# Patient Record
Sex: Male | Born: 1975 | Race: White | Hispanic: No | Marital: Married | Smoking: Never smoker
Health system: Southern US, Community
[De-identification: ages and names within clinical notes are randomized; demographics above are authoritative.]

## PROBLEM LIST (undated history)

## (undated) DIAGNOSIS — F419 Anxiety disorder, unspecified: Secondary | ICD-10-CM

## (undated) DIAGNOSIS — G709 Myoneural disorder, unspecified: Secondary | ICD-10-CM

## (undated) HISTORY — PX: REPAIR ANKLE LIGAMENT: SUR1187

## (undated) HISTORY — PX: FRACTURE SURGERY: SHX138

## (undated) HISTORY — PX: APPENDECTOMY: SHX54

---

## 2003-07-01 ENCOUNTER — Inpatient Hospital Stay (HOSPITAL_COMMUNITY): Admission: EM | Admit: 2003-07-01 | Discharge: 2003-07-02 | Payer: Self-pay | Admitting: Emergency Medicine

## 2003-07-01 ENCOUNTER — Encounter (INDEPENDENT_AMBULATORY_CARE_PROVIDER_SITE_OTHER): Payer: Self-pay | Admitting: *Deleted

## 2005-11-15 ENCOUNTER — Emergency Department (HOSPITAL_COMMUNITY): Admission: EM | Admit: 2005-11-15 | Discharge: 2005-11-16 | Payer: Self-pay | Admitting: Emergency Medicine

## 2007-06-14 ENCOUNTER — Emergency Department (HOSPITAL_COMMUNITY): Admission: EM | Admit: 2007-06-14 | Discharge: 2007-06-15 | Payer: Self-pay | Admitting: Emergency Medicine

## 2007-06-19 ENCOUNTER — Ambulatory Visit (HOSPITAL_COMMUNITY): Admission: RE | Admit: 2007-06-19 | Discharge: 2007-06-20 | Payer: Self-pay | Admitting: Orthopaedic Surgery

## 2010-08-12 ENCOUNTER — Ambulatory Visit
Admission: RE | Admit: 2010-08-12 | Discharge: 2010-08-12 | Disposition: A | Discharge status: Home / Self Care | Payer: BC Managed Care – PPO | Source: Ambulatory Visit | Attending: Ophthalmology | Admitting: Ophthalmology

## 2010-08-12 ENCOUNTER — Other Ambulatory Visit: Payer: Self-pay | Admitting: Ophthalmology

## 2010-08-12 DIAGNOSIS — R51 Headache: Secondary | ICD-10-CM

## 2010-08-12 MED ORDER — IOHEXOL 300 MG/ML  SOLN
75.0000 mL | Freq: Once | INTRAMUSCULAR | Status: AC | PRN
  Administered 2010-08-12: 75 mL via INTRAVENOUS

## 2010-10-20 NOTE — Op Note (Signed)
Cory Alexander, Cory Alexander              ACCOUNT NO.:  [id number]   MEDICAL RECORD NO.:  [id number]          PATIENT TYPE:  OIB   LOCATION:  3528                         FACILITY:  [facility]   PHYSICIAN:  Oma C. Ophelia Charter, M.D.    DATE OF BIRTH:  July 23, 1975   DATE OF PROCEDURE:  06/19/2007  DATE OF DISCHARGE:                               OPERATIVE REPORT   PREOPERATIVE DIAGNOSIS:  Comminuted left distal radius fracture.   POSTOPERATIVE DIAGNOSIS:  Comminuted left distal radius fracture.   PROCEDURE:  ORIF left distal radius, volar plating.   SURGEON:  Nesbit C. Ophelia Charter, M.D.   ANESTHESIA:  MAL, general.   TOURNIQUET TIME:  1 hour 24 minutes.   DRAINS:  None.   COMPONENTS USED:  [employer] volar plate, wide, standard  length.   BRIEF HISTORY:  This 35 year old male was playing in a basketball game,  was running, tripped over a curb, fell, suffering a distal radius  fracture, comminuted, with some volar and dorsal comminution and  involvement of the distal RU joint with displacement.   PROCEDURE:  After induction of anesthesia, proximal arm tourniquet,  standard prepping and draping up to the level the tourniquet,  stockinette, extremity sheets and drapes were applied.  Time-out  procedure was taken.  The patient received preoperative antibiotics.  Time-out was confirmed.   Sterile skin marker was used, followed by reapplication of stockinette,  sponge in the palm, wrapped with an Esmarch, tourniquet inflation to  250.  A volar incision was made, and split over the FCR was performed,  and then splitting through the posterior aspect of the FCR sheath.  Pronator was identified and peeled from the radial aspect of the radius  subperiosteally to expose the fracture site.  Care was taken to not put  undue tension, and FCR was used to help protect the radial artery.  There was some cortical deformation of the distal fragment, and the  fracture was distracted by manual traction.  A  Cory Alexander was placed, and  hematoma was suctioned out, as well as a tiny curette was used with  irrigation to clamp the fracture site.  Fracture was reduced, checked  under fluoroscopy, and a K-wire was drilled across the styloid to help  stabilize it due to the comminution.  This held it out to length, and  with the volar cortex reduced and the pin in the styloid piece, the  volar cortex was stable.  Wide plate was selected, since the patient was  large, and plate was adjusted proximally and distally, medial and  lateral, checked under fluoroscopy, and then the slotted screw was  drilled, which was an 18.  Another screw was placed more proximally, 16,  and then the distal row started to fill, and it was checked under  fluoroscopy, starting from the ulnar aspect distal row.  It was apparent  that the plate was a little too far distal.  Screws were backed up.  They were coming up next to the joint.  With the drill, the distalmost  ulnar peg was just a 16 or 18 and was already hitting  a portion of the  joint.  The most proximal screw was removed.  Slotted screw was backed  up.  Plate was slid slightly more proximal, then locked down, checked  under fluoroscopy, and then the pegs placed starting on the distal row,  extending toward the radial aspect, and then proximal row starting in  the ulnar aspect, with threaded screw in the proximal ulnarmost hole,  and all the rest were smooth pegs.  All screws were checked.  All were  locked down.  A total of three bicortical screws were placed proximally,  the one in the slot and then the two more proximal to that.  The wound  was irrigated.  Spot fluoro pictures were taken with the wrist in  supination and full pronation AP, with hand elevated looking directly  down the joint.  No screws were in the joint.  There was anatomic  position and reduction with less than 0.5 or maybe 0.25 mm displacement  seen on fluoroscopy and with direct visualization.   After irrigation  with saline solution, 3-0 Vicryl was used in the subcutaneous tissue.  The wound was irrigated, tourniquet deflated prior to closure, 4-0  Vicryl subcuticular.  Tincture of Benzoin, Steri-Strips, Marcaine  infiltration.  Xeroform, 4 x 4's, __________ , and a dorsal splint.  Instrument count and needle count was correct.      Kristen C. Ophelia Charter, M.D.  Electronically Signed     MCY/MEDQ  D:  06/19/2007  T:  06/20/2007  Job:  [id number]

## 2010-10-23 NOTE — Op Note (Signed)
Cory Alexander, Cory Alexander                          ACCOUNT NO.:  [id number]   MEDICAL RECORD NO.:  [id number]                   PATIENT TYPE:  INP   LOCATION:  1825                                 FACILITY:  [facility]   PHYSICIAN:  Gabrielle Dare. [name], M.D.             DATE OF BIRTH:  08-Sep-1975   DATE OF PROCEDURE:  07/01/2003  DATE OF DISCHARGE:                                 OPERATIVE REPORT   PREOPERATIVE DIAGNOSIS:  Acute appendicitis.   POSTOPERATIVE DIAGNOSIS:  Acute appendicitis.   OPERATION PERFORMED:  Laparoscopic appendectomy.   SURGEON:  Gabrielle Dare. [name], M.D.   ANESTHESIA:  General.   INDICATIONS FOR PROCEDURE:  The patient is a 35 year old white male with a  history of an anxiety disorder, who is otherwise healthy, who developed  abdominal pain last night.  He had no appetite all day yesterday, eating  only a few crackers.  He felt somewhat bloated and developed some  generalized abdominal pain that localized in his right lower quadrant.  He  came to [facility] Emergency Department for evaluation.  Work-up there  showed his white blood cell at 17,000. CT scan of the abdomen and pelvis was  obtained which was consistent with acute appendicitis.  On physical exam the  patient was found to have amplified tenderness and guarding in the right  lower quadrant.  He is brought to the operating room for emergent  appendectomy.   DESCRIPTION OF PROCEDURE:  Informed consent was obtained.  The patient  received intravenous antibiotics and was brought to the operating room.  General anesthesia was administered.  His abdomen was prepped and draped in  sterile fashion.  A curvilinear infraumbilical incision was made.  Subcutaneous tissues were dissected down revealing the anterior fracture  which was divided sharply.  The peritoneal cavity was then entered under  direct vision without difficulty.  A 0 Vicryl pursestring suture was placed  around the fascial opening.  The Hasson  trocar was inserted into the abdomen  and the abdomen was insufflated with carbon dioxide in the standard fashion.  The abdomen was explored.  This revealed an inflamed appendix.  Under direct  vision, a 12 mm left lower quadrant coordinating 5 mm right upper quadrant  port were placed.  Subsequently the appendix was closely inspected.  The  base was not inflamed but from proximally, mid to midportion distally it was  significantly inflamed with some fibrinous exudate but no frank perforation.  There was no significant free fluid. First the base was circumferentially  dissected.  Once a nice window was made around the base of the appendix,  this was divided at the cecum with an endoscopic GIA stapler with a tissue  load.  Subsequently, the mesoappendix was freed up with some blunt  dissection and then divided sequentially with several reloads of the  endoscopic GIA stapler with vascular insert.  These were fired along the  underside  of the appendix.  It was quite lengthy and several firings were  required.  Excellent hemostasis was obtained.  Once the appendix was  removed, it was placed in an EndoCatch bag and taken out the abdomen via the  left lower quadrant port site.  The cecum and terminal ileum area were  reinspected.  All of the staple lines were intact and the area was  hemostatic.  The area was then irrigated with two liters of sterile saline.  The fluid evacuated was clear and staple lines were reinspected. Good  hemostasis persisted.  The ports were taken out under direct vision.  The  pneumoperitoneum was released and the Hasson trocar was removed.  The fascia  at the umbilical port site was closed by tying the 0 Vicryl pursestring  suture. All three wounds were copiously irrigated.  0.25% Marcaine with  epinephrine had been used for local anesthetic and skin was closed with  running 4-0 Vicryl  subcuticular stitch at each incision and Benzoin and Steri-Strips were  applied.   Sterile dressings were placed.  Sponge, needle and instrument  counts were all correct. The patient tolerated the procedure well without  apparent complication and was taken to the recovery room in stable  condition.                                               Gabrielle Dare [name], M.D.    BET/MEDQ  D:  07/01/2003  T:  07/01/2003  Job:  [id number]

## 2010-10-23 NOTE — Discharge Summary (Signed)
Cory Alexander, Cory Alexander                          ACCOUNT NO.:  [id number]   MEDICAL RECORD NO.:  [id number]                   PATIENT TYPE:  INP   LOCATION:  5735                                 FACILITY:  [facility]   PHYSICIAN:  Gabrielle Dare. [name], M.D.             DATE OF BIRTH:  1975-09-07   DATE OF ADMISSION:  06/30/2003  DATE OF DISCHARGE:  07/02/2003                                 DISCHARGE SUMMARY   DISCHARGE DIAGNOSES:  1. Acute appendicitis.  2. Status post laparoscopic appendectomy.   HISTORY OF PRESENT ILLNESS:  The patient is a 35 year old white male with a  history of anxiety disorder who was otherwise healthy.  He developed  abdominal pain.  Workup in the emergency department was consistent with  acute appendicitis and he was admitted and taken to the operating room  emergently.   HOSPITAL COURSE:  The patient underwent an uncomplicated laparoscopic  appendectomy for a very inflamed but nonperforated appendicitis.  Postoperatively, he remained afebrile and hemodynamically stable.  He  tolerated gradual advancement of his diet.  He had good pain control with  oral pain medication and was discharged home in stable condition.   DISCHARGE DIET:  Regular.   DISCHARGE ACTIVITY:  No lifting.   DISCHARGE MEDICATIONS:  1. Percocet 5/325 mg one to two p.o. q.4-6h. p.r.n. pain.  2. He also is to continue his Paxil as previously.   FOLLOW UP:  Gabrielle Dare. [name], M.D., in three weeks.                                                Gabrielle Dare [name], M.D.    BET/MEDQ  D:  07/02/2003  T:  07/02/2003  Job:  [id number]

## 2010-10-23 NOTE — H&P (Signed)
NAMESLATON, Cory Alexander                          ACCOUNT NO.:  [id number]   MEDICAL RECORD NO.:  [id number]                   PATIENT TYPE:  INP   LOCATION:  1825                                 FACILITY:  [facility]   PHYSICIAN:  Gabrielle Dare. [name], M.D.             DATE OF BIRTH:  1975/12/02   DATE OF ADMISSION:  07/01/2003  DATE OF DISCHARGE:                                HISTORY & PHYSICAL   CHIEF COMPLAINT:  Right lower quadrant abdominal pain.   HISTORY OF PRESENT ILLNESS:  The patient is a 35 year old white male with a  history of anxiety disorder, who developed abdominal pain last night.  All  during the day yesterday, he had decreased appetite and only ate some  crackers.  The pain persisted and localized more in his right lower  quadrant; he presented to the emergency department at [facility] for  evaluation.  Workup by the emergency department physician included white  blood count which was elevated at 17,000.  CT scan of the abdomen and pelvis  was obtained; this was consistent with acute appendicitis.  The patient  claims the pain has been pretty steady, it stays down in his right lower  quadrant at this time without radiation and he denies any other complaints  at this time.   PAST MEDICAL HISTORY:  Anxiety disorder.   PAST SURGICAL HISTORY:  Wisdom teeth extraction.   SOCIAL HISTORY:  He does not smoke.  He drinks alcohol occasionally.  He  works as an Dance movement psychotherapist.   MEDICATIONS:  Paxil 25 mg p.o. daily.   ALLERGIES:  No known drug allergies.   REVIEW OF SYSTEMS:  GENERAL:  Patient feels moderately ill.  CARDIOVASCULAR:  Negative.  PULMONARY:  Negative.  GI:  See the history of present illness.  GU:  Negative.  MUSCULOSKELETAL:  Negative.   PHYSICAL EXAMINATION:  VITAL SIGNS:  Temperature is 101.0, blood pressure  126/78, heart rate 82, respirations 18.  GENERAL:  He is awake and alert.  HEENT:  Pupils are equal and reactive.  Sclerae are nonicteric.  NECK:  His neck  is supple with no masses.  LUNGS:  Lungs are clear to auscultation bilaterally.  HEART:  Regular rate and rhythm.  PMI is palpable in the left chest.  ABDOMEN:  His abdomen is soft with decreased bowel sounds.  He does have  localized tenderness in the right lower quadrant with guarding.  He also has  a positive Rovsing's sign.  SKIN:  Skin is warm and dry with no rashes.  EXTREMITIES:  The extremities have no significant pedal edema.   DATA:  Data reviewed include white blood cell count 17,000 and CT scan of  the abdomen and pelvis consistent with acute appendicitis.   IMPRESSION AND PLAN:  Acute appendicitis.   PLAN:  We will take him to the operating room for an emergency appendectomy.  The procedure of laparoscopic appendectomy including its risks  and benefits  were discussed in detail with the patient, who is eager to proceed.  Other  questions were answered and we will go on to the operating room.                                                Gabrielle Dare [name], M.D.    BET/MEDQ  D:  07/01/2003  T:  07/01/2003  Job:  [id number]

## 2011-02-24 LAB — CBC
MCHC: 34.7
MCV: 91.8
RDW: 12.5

## 2011-02-24 LAB — BASIC METABOLIC PANEL
BUN: 9
CO2: 30
Calcium: 9.5
Chloride: 106
Creatinine, Ser: 1.07
Glucose, Bld: 93

## 2012-02-02 ENCOUNTER — Encounter (HOSPITAL_COMMUNITY): Payer: Self-pay | Admitting: Pharmacy Technician

## 2012-02-03 ENCOUNTER — Other Ambulatory Visit (HOSPITAL_COMMUNITY): Payer: Self-pay | Admitting: Orthopedic Surgery

## 2012-02-04 ENCOUNTER — Encounter (HOSPITAL_COMMUNITY)
Admission: RE | Admit: 2012-02-04 | Discharge: 2012-02-04 | Disposition: A | Discharge status: Home / Self Care | Payer: BC Managed Care – PPO | Source: Ambulatory Visit | Attending: Orthopedic Surgery | Admitting: Orthopedic Surgery

## 2012-02-04 ENCOUNTER — Encounter (HOSPITAL_COMMUNITY): Payer: Self-pay

## 2012-02-04 HISTORY — DX: Myoneural disorder, unspecified: G70.9

## 2012-02-04 LAB — CBC
HCT: 39.7 % (ref 39.0–52.0)
Hemoglobin: 14.1 g/dL (ref 13.0–17.0)
MCH: 30.9 pg (ref 26.0–34.0)
MCHC: 35.5 g/dL (ref 30.0–36.0)
RDW: 12.4 % (ref 11.5–15.5)

## 2012-02-04 LAB — SURGICAL PCR SCREEN
MRSA, PCR: NEGATIVE
Staphylococcus aureus: NEGATIVE

## 2012-02-04 NOTE — Pre-Procedure Instructions (Signed)
20 Cory Alexander  02/04/2012   Your procedure is scheduled on:  02/08/2012  Report to [facility] at 5:30 AM.  Call this number if you have problems the morning of surgery: [phone]   Remember:   Do not eat food or drink any liquidAfter Midnight.- MONDAY    Take these medicines the morning of surgery with A SIP OF WATER: NONE   Do not wear jewelry, make-up or nail polish.  Do not wear lotions, powders, or perfumes. You may wear deodorant.  Do not shave 48 hours prior to surgery. Men may shave face and neck.  Do not bring valuables to the hospital.  Contacts, dentures or bridgework may not be worn into surgery.  Leave suitcase in the car. After surgery it may be brought to your room.  For patients admitted to the hospital, checkout time is 11:00 AM the day of discharge.   Patients discharged the day of surgery will not be allowed to drive home.  Name and phone number of your driver: /w friend  Special Instructions: CHG Shower Use Special Wash: 1/2 bottle night before surgery and 1/2 bottle morning of surgery.   Please read over the following fact sheets that you were given: Pain Booklet, Coughing and Deep Breathing, MRSA Information and Surgical Site Infection Prevention

## 2012-02-04 NOTE — Progress Notes (Signed)
Call to Dr. Diamantina Providence office, spoke with Leotis Shames, requested that Dr. August Saucer, sign orders.

## 2012-02-07 MED ORDER — CEFAZOLIN SODIUM-DEXTROSE 2-3 GM-% IV SOLR
2.0000 g | INTRAVENOUS | Status: AC
  Administered 2012-02-08: 2 g via INTRAVENOUS
  Filled 2012-02-07: qty 50

## 2012-02-08 ENCOUNTER — Encounter (HOSPITAL_COMMUNITY): Payer: Self-pay | Admitting: *Deleted

## 2012-02-08 ENCOUNTER — Ambulatory Visit (HOSPITAL_COMMUNITY)
Admission: RE | Admit: 2012-02-08 | Discharge: 2012-02-08 | Disposition: A | Discharge status: Home / Self Care | Payer: BC Managed Care – PPO | Source: Ambulatory Visit | Attending: Orthopedic Surgery | Admitting: Orthopedic Surgery

## 2012-02-08 ENCOUNTER — Encounter (HOSPITAL_COMMUNITY)
Admission: RE | Disposition: A | Discharge status: Home / Self Care | Payer: Self-pay | Source: Ambulatory Visit | Attending: Orthopedic Surgery

## 2012-02-08 ENCOUNTER — Ambulatory Visit (HOSPITAL_COMMUNITY): Payer: BC Managed Care – PPO | Admitting: Anesthesiology

## 2012-02-08 ENCOUNTER — Encounter (HOSPITAL_COMMUNITY): Payer: Self-pay | Admitting: Anesthesiology

## 2012-02-08 DIAGNOSIS — S46219A Strain of muscle, fascia and tendon of other parts of biceps, unspecified arm, initial encounter: Secondary | ICD-10-CM

## 2012-02-08 DIAGNOSIS — S43499A Other sprain of unspecified shoulder joint, initial encounter: Secondary | ICD-10-CM | POA: Insufficient documentation

## 2012-02-08 DIAGNOSIS — Z9089 Acquired absence of other organs: Secondary | ICD-10-CM | POA: Insufficient documentation

## 2012-02-08 DIAGNOSIS — X500XXA Overexertion from strenuous movement or load, initial encounter: Secondary | ICD-10-CM | POA: Insufficient documentation

## 2012-02-08 DIAGNOSIS — Y9364 Activity, baseball: Secondary | ICD-10-CM | POA: Insufficient documentation

## 2012-02-08 HISTORY — PX: DISTAL BICEPS TENDON REPAIR: SHX1461

## 2012-02-08 SURGERY — REPAIR, TENDON, BICEPS, DISTAL
Anesthesia: General | Site: Arm Upper | Laterality: Left | Wound class: Clean

## 2012-02-08 MED ORDER — METHOCARBAMOL 500 MG PO TABS
ORAL_TABLET | ORAL | Status: AC
  Filled 2012-02-08: qty 1

## 2012-02-08 MED ORDER — HYDROMORPHONE HCL PF 1 MG/ML IJ SOLN
0.2500 mg | INTRAMUSCULAR | Status: DC | PRN
  Administered 2012-02-08 (×4): 0.5 mg via INTRAVENOUS

## 2012-02-08 MED ORDER — MIDAZOLAM HCL 5 MG/5ML IJ SOLN
INTRAMUSCULAR | Status: DC | PRN
  Administered 2012-02-08: 2 mg via INTRAVENOUS

## 2012-02-08 MED ORDER — LACTATED RINGERS IV SOLN
INTRAVENOUS | Status: DC | PRN
  Administered 2012-02-08: 07:00:00 via INTRAVENOUS

## 2012-02-08 MED ORDER — OXYCODONE HCL 5 MG PO TABS
5.0000 mg | ORAL_TABLET | Freq: Once | ORAL | Status: AC
  Administered 2012-02-08: 5 mg via ORAL

## 2012-02-08 MED ORDER — 0.9 % SODIUM CHLORIDE (POUR BTL) OPTIME
TOPICAL | Status: DC | PRN
  Administered 2012-02-08 (×2): 1000 mL

## 2012-02-08 MED ORDER — ROCURONIUM BROMIDE 100 MG/10ML IV SOLN
INTRAVENOUS | Status: DC | PRN
  Administered 2012-02-08: 50 mg via INTRAVENOUS

## 2012-02-08 MED ORDER — GLYCOPYRROLATE 0.2 MG/ML IJ SOLN
INTRAMUSCULAR | Status: DC | PRN
  Administered 2012-02-08: .8 mg via INTRAVENOUS

## 2012-02-08 MED ORDER — LACTATED RINGERS IV SOLN: INTRAVENOUS | Status: DC

## 2012-02-08 MED ORDER — LIDOCAINE HCL (CARDIAC) 20 MG/ML IV SOLN
INTRAVENOUS | Status: DC | PRN
  Administered 2012-02-08: 100 mg via INTRAVENOUS

## 2012-02-08 MED ORDER — PROMETHAZINE HCL 25 MG/ML IJ SOLN: 6.2500 mg | INTRAMUSCULAR | Status: DC | PRN

## 2012-02-08 MED ORDER — ACETAMINOPHEN 10 MG/ML IV SOLN
INTRAVENOUS | Status: DC | PRN
  Administered 2012-02-08: 1000 mg via INTRAVENOUS

## 2012-02-08 MED ORDER — NEOSTIGMINE METHYLSULFATE 1 MG/ML IJ SOLN
INTRAMUSCULAR | Status: DC | PRN
  Administered 2012-02-08: 5 mg via INTRAVENOUS

## 2012-02-08 MED ORDER — ACETAMINOPHEN 10 MG/ML IV SOLN
INTRAVENOUS | Status: AC
  Filled 2012-02-08: qty 100

## 2012-02-08 MED ORDER — MEPERIDINE HCL 25 MG/ML IJ SOLN: 6.2500 mg | INTRAMUSCULAR | Status: DC | PRN

## 2012-02-08 MED ORDER — HYDROMORPHONE HCL PF 1 MG/ML IJ SOLN
INTRAMUSCULAR | Status: AC
  Filled 2012-02-08: qty 2

## 2012-02-08 MED ORDER — ONDANSETRON HCL 4 MG/2ML IJ SOLN
INTRAMUSCULAR | Status: DC | PRN
  Administered 2012-02-08: 4 mg via INTRAVENOUS

## 2012-02-08 MED ORDER — METHOCARBAMOL 500 MG PO TABS
500.0000 mg | ORAL_TABLET | Freq: Once | ORAL | Status: AC
  Administered 2012-02-08: 500 mg via ORAL

## 2012-02-08 MED ORDER — KETOROLAC TROMETHAMINE 60 MG/2ML IM SOLN
INTRAMUSCULAR | Status: DC | PRN
  Administered 2012-02-08: 30 mg via INTRAMUSCULAR

## 2012-02-08 MED ORDER — FENTANYL CITRATE 0.05 MG/ML IJ SOLN
INTRAMUSCULAR | Status: DC | PRN
  Administered 2012-02-08: 50 ug via INTRAVENOUS
  Administered 2012-02-08: 150 ug via INTRAVENOUS
  Administered 2012-02-08: 50 ug via INTRAVENOUS

## 2012-02-08 MED ORDER — OXYCODONE HCL 5 MG PO TABS
ORAL_TABLET | ORAL | Status: AC
  Filled 2012-02-08: qty 1

## 2012-02-08 MED ORDER — PROPOFOL 10 MG/ML IV EMUL
INTRAVENOUS | Status: DC | PRN
  Administered 2012-02-08: 270 mg via INTRAVENOUS

## 2012-02-08 SURGICAL SUPPLY — 64 items
APL SKNCLS STERI-STRIP NONHPOA (GAUZE/BANDAGES/DRESSINGS) ×1
BANDAGE ELASTIC 3 VELCRO ST LF (GAUZE/BANDAGES/DRESSINGS) ×1 IMPLANT
BANDAGE ELASTIC 4 VELCRO ST LF (GAUZE/BANDAGES/DRESSINGS) ×2 IMPLANT
BANDAGE GAUZE ELAST BULKY 4 IN (GAUZE/BANDAGES/DRESSINGS) IMPLANT
BENZOIN TINCTURE PRP APPL 2/3 (GAUZE/BANDAGES/DRESSINGS) ×1 IMPLANT
BNDG CMPR 9X4 STRL LF SNTH (GAUZE/BANDAGES/DRESSINGS) ×1
BNDG ESMARK 4X9 LF (GAUZE/BANDAGES/DRESSINGS) ×2 IMPLANT
CLOTH BEACON ORANGE TIMEOUT ST (SAFETY) ×2 IMPLANT
CLSR STERI-STRIP ANTIMIC 1/2X4 (GAUZE/BANDAGES/DRESSINGS) ×1 IMPLANT
CORDS BIPOLAR (ELECTRODE) ×2 IMPLANT
COVER SURGICAL LIGHT HANDLE (MISCELLANEOUS) ×2 IMPLANT
CUFF TOURNIQUET SINGLE 18IN (TOURNIQUET CUFF) ×2 IMPLANT
CUFF TOURNIQUET SINGLE 24IN (TOURNIQUET CUFF) IMPLANT
CURVTEK CARTRIDGE ×1 IMPLANT
DECANTER SPIKE VIAL GLASS SM (MISCELLANEOUS) IMPLANT
DRAPE INCISE IOBAN 66X45 STRL (DRAPES) ×2 IMPLANT
DRAPE PROXIMA HALF (DRAPES) ×1 IMPLANT
DRSG PAD ABDOMINAL 8X10 ST (GAUZE/BANDAGES/DRESSINGS) ×1 IMPLANT
DURAPREP 26ML APPLICATOR (WOUND CARE) ×2 IMPLANT
GAUZE XEROFORM 1X8 LF (GAUZE/BANDAGES/DRESSINGS) ×1 IMPLANT
GLOVE BIO SURGEON STRL SZ 6.5 (GLOVE) ×1 IMPLANT
GLOVE BIO SURGEON STRL SZ7 (GLOVE) ×1 IMPLANT
GLOVE BIOGEL M 7.0 STRL (GLOVE) ×1 IMPLANT
GLOVE BIOGEL PI IND STRL 7.5 (GLOVE) IMPLANT
GLOVE BIOGEL PI IND STRL 8 (GLOVE) ×1 IMPLANT
GLOVE BIOGEL PI INDICATOR 7.5 (GLOVE) ×2
GLOVE BIOGEL PI INDICATOR 8 (GLOVE) ×1
GLOVE ECLIPSE 7.0 STRL STRAW (GLOVE) ×1 IMPLANT
GLOVE SURG ORTHO 8.0 STRL STRW (GLOVE) ×2 IMPLANT
GLOVE SURG SS PI 7.5 STRL IVOR (GLOVE) ×1 IMPLANT
GOWN PREVENTION PLUS LG XLONG (DISPOSABLE) ×1 IMPLANT
GOWN STRL NON-REIN LRG LVL3 (GOWN DISPOSABLE) ×6 IMPLANT
KIT BASIN OR (CUSTOM PROCEDURE TRAY) ×2 IMPLANT
KIT ROOM TURNOVER OR (KITS) ×2 IMPLANT
MANIFOLD NEPTUNE II (INSTRUMENTS) ×1 IMPLANT
NEEDLE 27GAX1X1/2 (NEEDLE) IMPLANT
NS IRRIG 1000ML POUR BTL (IV SOLUTION) ×2 IMPLANT
PACK ORTHO EXTREMITY (CUSTOM PROCEDURE TRAY) ×2 IMPLANT
PAD ARMBOARD 7.5X6 YLW CONV (MISCELLANEOUS) ×4 IMPLANT
PAD CAST 3X4 CTTN HI CHSV (CAST SUPPLIES) IMPLANT
PADDING CAST COTTON 3X4 STRL (CAST SUPPLIES) ×2
PADDING CAST SYNTHETIC 4 (CAST SUPPLIES) ×1
PADDING CAST SYNTHETIC 4X4 STR (CAST SUPPLIES) IMPLANT
PASSER SUT SWANSON 36MM LOOP (INSTRUMENTS) ×1 IMPLANT
SLING ARM FOAM STRAP LRG (SOFTGOODS) ×1 IMPLANT
SPONGE GAUZE 4X4 12PLY (GAUZE/BANDAGES/DRESSINGS) ×1 IMPLANT
SPONGE LAP 4X18 X RAY DECT (DISPOSABLE) ×2 IMPLANT
STRIP CLOSURE SKIN 1/2X4 (GAUZE/BANDAGES/DRESSINGS) ×2 IMPLANT
SUCTION FRAZIER TIP 10 FR DISP (SUCTIONS) IMPLANT
SUT 2 FIBERLOOP 20 STRT BLUE (SUTURE) ×2
SUT ETHILON 5 0 PS 2 18 (SUTURE) IMPLANT
SUT PROLENE 3 0 PS 2 (SUTURE) ×1 IMPLANT
SUT VIC AB 2-0 CT1 27 (SUTURE) ×4
SUT VIC AB 2-0 CT1 TAPERPNT 27 (SUTURE) IMPLANT
SUT VIC AB 2-0 FS1 27 (SUTURE) ×2 IMPLANT
SUT VIC AB 3-0 FS2 27 (SUTURE) ×2 IMPLANT
SUT VIC AB 4-0 PS2 27 (SUTURE) ×1 IMPLANT
SUTURE 2 FIBERLOOP 20 STRT BLU (SUTURE) IMPLANT
SYR CONTROL 10ML LL (SYRINGE) IMPLANT
TOWEL OR 17X24 6PK STRL BLUE (TOWEL DISPOSABLE) ×2 IMPLANT
TOWEL OR 17X26 10 PK STRL BLUE (TOWEL DISPOSABLE) ×2 IMPLANT
TUBE CONNECTING 12X1/4 (SUCTIONS) IMPLANT
UNDERPAD 30X30 INCONTINENT (UNDERPADS AND DIAPERS) ×2 IMPLANT
WATER STERILE IRR 1000ML POUR (IV SOLUTION) ×1 IMPLANT

## 2012-02-08 NOTE — Progress Notes (Signed)
Pt became pale but remained dry  Upon arrival and again after dressing ... No diaphoresis... No vomiting ... And pt insisted on going  Home ... At this time... With friend driving .Marland Kitchen Ice packs x2 and sling  In place on the left hand.

## 2012-02-08 NOTE — Anesthesia Preprocedure Evaluation (Addendum)
Anesthesia Evaluation  Patient identified by MRN, date of birth, ID band Patient awake    Reviewed: Allergy & Precautions, H&P , NPO status , Patient's Chart, lab work & pertinent test results  Airway Mallampati: I  Neck ROM: Full    Dental  (+) Teeth Intact   Pulmonary neg pulmonary ROS,  breath sounds clear to auscultation        Cardiovascular negative cardio ROS  Rhythm:Regular Rate:Normal     Neuro/Psych negative neurological ROS  negative psych ROS   GI/Hepatic negative GI ROS, Neg liver ROS,   Endo/Other    Renal/GU negative Renal ROS  negative genitourinary   Musculoskeletal negative musculoskeletal ROS (+)   Abdominal   Peds  Hematology negative hematology ROS (+)   Anesthesia Other Findings   Reproductive/Obstetrics                          Anesthesia Physical Anesthesia Plan  ASA: I  Anesthesia Plan: General   Post-op Pain Management:    Induction: Intravenous  Airway Management Planned: Oral ETT  Additional Equipment:   Intra-op Plan:   Post-operative Plan: Extubation in OR  Informed Consent: I have reviewed the patients History and Physical, chart, labs and discussed the procedure including the risks, benefits and alternatives for the proposed anesthesia with the patient or authorized representative who has indicated his/her understanding and acceptance.   Dental advisory given  Plan Discussed with: CRNA, Anesthesiologist and Surgeon  Anesthesia Plan Comments:         Anesthesia Quick Evaluation

## 2012-02-08 NOTE — Preoperative (Signed)
Beta Blockers   Reason not to administer Beta Blockers:Not Applicable 

## 2012-02-08 NOTE — Anesthesia Procedure Notes (Signed)
Procedure Name: Intubation Date/Time: 02/08/2012 8:12 AM Performed by: Glendora Score A Pre-anesthesia Checklist: Patient identified, Emergency Drugs available, Suction available and Patient being monitored Patient Re-evaluated:Patient Re-evaluated prior to inductionOxygen Delivery Method: Circle system utilized Preoxygenation: Pre-oxygenation with 100% oxygen Intubation Type: IV induction Ventilation: Mask ventilation without difficulty Grade View: Grade I Tube type: Oral Tube size: 7.5 mm Number of attempts: 1 Airway Equipment and Method: Stylet Placement Confirmation: ETT inserted through vocal cords under direct vision,  positive ETCO2 and breath sounds checked- equal and bilateral Secured at: 23 cm Tube secured with: Tape Dental Injury: Teeth and Oropharynx as per pre-operative assessment

## 2012-02-08 NOTE — H&P (Signed)
Cory Alexander is an 36 y.o. male.   Chief Complaint: Left elbow pain HPI: Demar is a 36 year old patient with left elbow pain. One week ago he was swinging a softball bat and felt a pop in his left arm. Experienced weakness and immediate onset of pain in the distal anterior elbow region. Presented to the office and was diagnosed on exam and with ultrasound with complete distal biceps rupture. He presents now for operative management. He denies any other orthopedic complaints. Does not report any significant history of deep vein thrombosis.  Past Medical History  Diagnosis Date  . Neuromuscular disorder     L arm, bicep tendon injury, R hamstring injured     Past Surgical History  Procedure Date  . Fracture surgery     06/2007- L wrist- repair  . Appendectomy     2005  . Repair ankle ligament     cleaned out cartilage     History reviewed. No pertinent family history. Social History:  reports that he has never smoked. He does not have any smokeless tobacco history on file. He reports that he drinks alcohol. He reports that he does not use illicit drugs.  Allergies: No Known Allergies  Medications Prior to Admission  Medication Sig Dispense Refill  . ibuprofen (ADVIL,MOTRIN) 200 MG tablet Take 200 mg by mouth every 6 (six) hours as needed.        No results found for this or any previous visit (from the past 48 hour(s)). No results found.  Review of Systems  Constitutional: Negative.   HENT: Negative.   Eyes: Negative.   Respiratory: Negative.   Cardiovascular: Negative.   Gastrointestinal: Negative.   Genitourinary: Negative.   Musculoskeletal: Positive for joint pain.  Skin: Negative.   Neurological: Negative.   Endo/Heme/Allergies: Negative.   Psychiatric/Behavioral: Negative.     Blood pressure 129/81, pulse 42, temperature 98.4 F (36.9 C), temperature source Oral, resp. rate 18, SpO2 96.00%. Physical Exam  Constitutional: He appears well-developed.  HENT:    Head: Normocephalic.  Eyes: Pupils are equal, round, and reactive to light.  Neck: Normal range of motion.  Cardiovascular: Normal rate.   Respiratory: Effort normal.  GI: Soft.   left elbow examination demonstrates absence of palpable biceps tendon supination strength is weak on the left compared to the right motor sensory function to the hand is intact radial pulses intact wrist and elbow range of motion is intact the left-hand side well-healed incision is present on the left wrist volarly consistent with prior distal radius fracture of the reduction internal fixation  Assessment/Plan Impression is left distal biceps rupture r in right-hand-dominant patient -  plan distal biceps rupture  repairusing the 2 incision technique risk and benefits are discussed with the patient. Include but are not limited to infection nerve vessel damage and ectopic ossification forearm synostosis and decreased elbow range of motion. We have plan is discussed time out of work discussed all questions answered.  Cyenna Rebello SCOTT 02/08/2012, 7:38 AM

## 2012-02-08 NOTE — Anesthesia Postprocedure Evaluation (Signed)
  Anesthesia Post-op Note  Patient: Cory Alexander  Procedure(s) Performed: Procedure(s) (LRB) with comments: DISTAL BICEPS TENDON REPAIR (Left) - Left distal biceps tendon repair  Patient Location: PACU  Anesthesia Type: General  Level of Consciousness: awake  Airway and Oxygen Therapy: Patient Spontanous Breathing  Post-op Pain: mild  Post-op Assessment: Post-op Vital signs reviewed  Post-op Vital Signs: stable  Complications: No apparent anesthesia complications

## 2012-02-08 NOTE — Brief Op Note (Signed)
02/08/2012  10:13 AM  PATIENT:  Loraine Leriche Fedora  36 y.o. male  PRE-OPERATIVE DIAGNOSIS:  Left distal biceps rupture  POST-OPERATIVE DIAGNOSIS:  Left distal biceps rupture  PROCEDURE:  Procedure(s): DISTAL BICEPS TENDON REPAIR  SURGEON:  Surgeon(s): Cammy Copa, MD  ASSISTANT: Jodene Nam  ANESTHESIA:   general  EBL: 15 ml    Total I/O In: 1000 [I.V.:1000] Out: -   BLOOD ADMINISTERED: none  DRAINS: none   LOCAL MEDICATIONS USED:  none  SPECIMEN:  No Specimen  COUNTS:  YES  TOURNIQUET:   Total Tourniquet Time Documented: Upper Arm (Left) - 61 minutes  DICTATION: .Other Dictation: Dictation Number [id number]  PLAN OF CARE: Discharge to home after PACU  PATIENT DISPOSITION:  PACU - hemodynamically stable

## 2012-02-08 NOTE — Progress Notes (Signed)
IV IN RIGHT HAND .Marland Kitchen. 18 GAUGE ... PLACED IN OR OR HOLDING  IN PLACE UPON ARRIVAL TO SHORT STAY POST OP ... REMOVED PRIOR TO DISCHARGE .Marland KitchenMarland Kitchen SITE UNREMARKABLE.. DRY STERILE 2X2 WITH TAPE .Marland Kitchen PLACED OVER THE  SITE.

## 2012-02-08 NOTE — Transfer of Care (Signed)
Immediate Anesthesia Transfer of Care Note  Patient: Cory Alexander  Procedure(s) Performed: Procedure(s) (LRB) with comments: DISTAL BICEPS TENDON REPAIR (Left) - Left distal biceps tendon repair  Patient Location: PACU  Anesthesia Type: General  Level of Consciousness: awake, alert , oriented and patient cooperative  Airway & Oxygen Therapy: Patient Spontanous Breathing and Patient connected to face mask oxygen  Post-op Assessment: Report given to PACU RN  Post vital signs: Reviewed and stable  Complications: No apparent anesthesia complications

## 2012-02-09 ENCOUNTER — Encounter (HOSPITAL_COMMUNITY): Payer: Self-pay | Admitting: Orthopedic Surgery

## 2012-02-09 NOTE — Op Note (Signed)
NAMEKORVIN, VALENTINE NO.:  [id number]  MEDICAL RECORD NO.:  [id number]  LOCATION:  [facility]                         FACILITY:  [facility]  PHYSICIAN:  Burnard Bunting, M.D.    DATE OF BIRTH:  February 07, 1976  DATE OF PROCEDURE:  02/08/2012 DATE OF DISCHARGE:  02/08/2012                              OPERATIVE REPORT   PREOPERATIVE DIAGNOSIS:  Left distal biceps rupture.  POSTOPERATIVE DIAGNOSIS:  Left distal biceps rupture.  PROCEDURE:  Left distal biceps rupture repair.  SURGEON:  Burnard Bunting, MD  ASSISTANT:  Wende Neighbors, PA  ANESTHESIA:  General endotracheal.  ESTIMATED BLOOD LOSS:  10 mL.  DRAINS:  None.  TOURNIQUET TIME:  1 hour at 250 mmHg.  INDICATIONS:  Makoto is a patient with left distal biceps rupture sustained 1 week ago, presents now for repair after explanation of risks and benefits.  PROCEDURE IN DETAIL:  The patient was brought to the operating room where general endotracheal anesthesia was induced.  Preoperative antibiotics were administered.  Time-out was called.  Left arm was prescrubbed with Betadine and alcohol, which allowed to air dry, prepped with DuraPrep solution and draped in a sterile manner.  Collier Flowers was used to cover the operative field.  Arm was elevated and exsanguinated with the Esmarch wrap.  Sterile tourniquet was inflated.  Transverse incision was made along the elbow flexion crease.  Skin and subcutaneous tissue were sharply divided.  Care taken to avoid injury to the lateral antebrachial cutaneous nerve.  Biceps tendon was visualized.  The pin was cut down by about 2 mm and tapered.  FiberLoop suture was placed. At this time, the path for the biceps tendon was palpated and the radial tuberosity was palpated.  Curved hemostat was then placed down in the native tunnel and exited anterior to the ulna.  At no time was the ulna exposed.  Soft tissue dissection was then performed through a separate incision laterally.  Skin and  subcutaneous tissue were sharply divided. Muscle and fascia was split sharply and bluntly.  Radial tuberosity with the hand in pronation was then visualized.  With the careful retraction on the radius itself, an osteotome was utilized to create a trough into the radial tuberosity.  The suture was passed.  Curved tech was then used to create two drill holes about 1 cm apart.  The tendon was then passed underneath the radius into the trough and tied with the arm in 90 degrees of flexion.  Excellent repair was achieved.  At this time before suturing the tendon into the trough, thorough irrigation with a liter of irrigating solution was performed.  The tendon did not compressing the neurovascular structures.  After the tendon was tied over the bone bridge, incision was thoroughly irrigated.  Tourniquet was released. Bleeding points were encountered and closed with bipolar electrocautery. Anterior incision was closed using interrupted inverted 3-0 Vicryl suture and running 4-0 subcuticular Vicryl suture, the lateral incision was closed using interrupted inverted 2-0 Vicryl suture, 3-0 Vicryl suture, and a running 3-0 pullout Prolene.  The patient tolerated the procedure well without immediate complication.  Velna Hatchet Vernon's assistance was required at all times during the case for retraction,  important neurovascular structures, and limb positioning.  Her assistance was a medical necessity.  Bulky well-padded splint was applied.  The patient was transferred to the recovery room and will be discharged to home.     Burnard Bunting, M.D.     GSD/MEDQ  D:  02/08/2012  T:  02/09/2012  Job:  [id number]

## 2012-09-20 ENCOUNTER — Encounter: Payer: Self-pay | Admitting: Family Medicine

## 2012-09-20 ENCOUNTER — Ambulatory Visit (INDEPENDENT_AMBULATORY_CARE_PROVIDER_SITE_OTHER): Payer: BC Managed Care – PPO | Admitting: Family Medicine

## 2012-09-20 VITALS — BP 125/81 | HR 65 | Ht 72.0 in | Wt 197.0 lb

## 2012-09-20 DIAGNOSIS — M7662 Achilles tendinitis, left leg: Secondary | ICD-10-CM

## 2012-09-20 DIAGNOSIS — M7660 Achilles tendinitis, unspecified leg: Secondary | ICD-10-CM

## 2012-09-20 MED ORDER — NITROGLYCERIN 0.2 MG/HR TD PT24: MEDICATED_PATCH | TRANSDERMAL | Status: DC

## 2012-09-20 NOTE — Patient Instructions (Addendum)
You have achilles tendinopathy Tylenol or aleve as needed for pain Lowering/raise on a step exercises 3 x 10 once a day Can add heel walks, toe walks forward and backward as well Icing 15 minutes at a time as needed for pain (do after running/exercising). Avoid uneven ground, hills as much as possible. Heel lifts in running shoes will help unload the tendon. Consider trying the Dr. Jari Sportsman active series inserts in your running shoes with or without the heel lift. Nitro patches 1/4 of 0.2mg /hr patch and change every day - put in a little different area around the tendon each day. Follow up with me in 6 weeks for reevaluation.

## 2012-09-21 ENCOUNTER — Encounter: Payer: Self-pay | Admitting: Family Medicine

## 2012-09-21 DIAGNOSIS — M7662 Achilles tendinitis, left leg: Secondary | ICD-10-CM | POA: Insufficient documentation

## 2012-09-21 NOTE — Assessment & Plan Note (Signed)
Left achilles tendinopathy - encouraged to continue with eccentric exercise program, stretching.  Heel lifts in shoes. Consider dr. Jari Sportsman active series (has this for walking shoes).  Start nitro patches given chronicity of this despite doing home eccentrics.  Avoid hills, uneven ground, barefoot walking.  F/u in 6 weeks for reevaluation.  Discussed risks of headache, skin rash with nitro.

## 2012-09-21 NOTE — Progress Notes (Signed)
  Subjective:    Patient ID: Cory Alexander, male    DOB: 19-May-1976, 37 y.o.   MRN: [id number]  PCP: Dr. Docia Chuck  HPI 37 yo M here for left achilles pain.  Patient is a runner - does about 15-20 miles a week. States over past 3 months has developed slowly worsening left achilles pain, some focal swelling. Has usual calf soreness but this has not changed in either calf muscle. Has been icing, doing eccentric exercises. Has a race coming up in about 3 weeks. Seems to ease once he gets further into his run (after about 1/2 to 1 mile). No injury.  Past Medical History  Diagnosis Date  . Neuromuscular disorder     L arm, bicep tendon injury, R hamstring injured     No current outpatient prescriptions on file prior to visit.   No current facility-administered medications on file prior to visit.    Past Surgical History  Procedure Laterality Date  . Fracture surgery      06/2007- L wrist- repair  . Appendectomy      2005  . Repair ankle ligament      cleaned out cartilage   . Distal biceps tendon repair  02/08/2012    Procedure: DISTAL BICEPS TENDON REPAIR;  Surgeon: Cammy Copa, MD;  Location: [facility] OR;  Service: Orthopedics;  Laterality: Left;  Left distal biceps tendon repair    No Known Allergies  History   Social History  . Marital Status: Married    Spouse Name: N/A    Number of Children: N/A  . Years of Education: N/A   Occupational History  . Not on file.   Social History Main Topics  . Smoking status: Never Smoker   . Smokeless tobacco: Not on file  . Alcohol Use: Yes     Comment: social   . Drug Use: No  . Sexually Active: Not on file   Other Topics Concern  . Not on file   Social History Narrative  . No narrative on file    Family History  Problem Relation Age of Onset  . Heart attack Father   . Diabetes Neg Hx   . Hyperlipidemia Neg Hx   . Hypertension Neg Hx   . Sudden death Neg Hx     BP 125/81  Pulse 65  Ht 6' (1.829 m)  Wt 197  lb (89.359 kg)  BMI 26.71 kg/m2  Review of Systems See HPI above.    Objective:   Physical Exam Gen: NAD  L foot/ankle: Small nodule noted visibly and on palpation of achilles not at insertion.  No other deformity, swelling, bruising. FROM ankle.  Able to do calf raise. TTP 2 cm proximal to achilles insertion on calcaneus.  No plantar fascia or other tenderness of foot/ankle. Negative ant drawer and talar tilt.   Negative syndesmotic compression. Negative calcaneal squeeze. Thompsons test negative. NV intact distally.    Assessment & Plan:  1. Left achilles tendinopathy - encouraged to continue with eccentric exercise program, stretching.  Heel lifts in shoes. Consider dr. Jari Sportsman active series (has this for walking shoes).  Start nitro patches given chronicity of this despite doing home eccentrics.  Avoid hills, uneven ground, barefoot walking.  F/u in 6 weeks for reevaluation.  Discussed risks of headache, skin rash with nitro.

## 2012-11-01 ENCOUNTER — Ambulatory Visit (INDEPENDENT_AMBULATORY_CARE_PROVIDER_SITE_OTHER): Payer: BC Managed Care – PPO | Admitting: Family Medicine

## 2012-11-01 ENCOUNTER — Encounter: Payer: Self-pay | Admitting: Family Medicine

## 2012-11-01 VITALS — BP 136/70 | HR 56 | Ht 72.0 in | Wt 200.0 lb

## 2012-11-01 DIAGNOSIS — M7662 Achilles tendinitis, left leg: Secondary | ICD-10-CM

## 2012-11-01 DIAGNOSIS — M7660 Achilles tendinitis, unspecified leg: Secondary | ICD-10-CM

## 2012-11-01 NOTE — Progress Notes (Signed)
Subjective:    Patient ID: Cory Alexander, male    DOB: 1975-08-18, 37 y.o.   MRN: [id number]  PCP: Dr. Docia Chuck  HPI  37 yo M here for f/u left achilles pain.  4/16: Patient is a runner - does about 15-20 miles a week. States over past 3 months has developed slowly worsening left achilles pain, some focal swelling. Has usual calf soreness but this has not changed in either calf muscle. Has been icing, doing eccentric exercises. Has a race coming up in about 3 weeks. Seems to ease once he gets further into his run (after about 1/2 to 1 mile). No injury.  5/28: Patient has improved since last visit. Doing home exercises daily. Stopped using heel lifts as these were uncomfortable. Nitro patches have been helping.  Past Medical History  Diagnosis Date  . Neuromuscular disorder     L arm, bicep tendon injury, R hamstring injured     Current Outpatient Prescriptions on File Prior to Visit  Medication Sig Dispense Refill  . nitroGLYCERIN (NITRODUR - DOSED IN MG/24 HR) 0.2 mg/hr 1/4th of a patch over affected achilles tendon, change daily  30 patch  1   No current facility-administered medications on file prior to visit.    Past Surgical History  Procedure Laterality Date  . Fracture surgery      06/2007- L wrist- repair  . Appendectomy      2005  . Repair ankle ligament      cleaned out cartilage   . Distal biceps tendon repair  02/08/2012    Procedure: DISTAL BICEPS TENDON REPAIR;  Surgeon: Cammy Copa, MD;  Location: [facility] OR;  Service: Orthopedics;  Laterality: Left;  Left distal biceps tendon repair    No Known Allergies  History   Social History  . Marital Status: Married    Spouse Name: N/A    Number of Children: N/A  . Years of Education: N/A   Occupational History  . Not on file.   Social History Main Topics  . Smoking status: Never Smoker   . Smokeless tobacco: Not on file  . Alcohol Use: Yes     Comment: social   . Drug Use: No  . Sexually  Active: Not on file   Other Topics Concern  . Not on file   Social History Narrative  . No narrative on file    Family History  Problem Relation Age of Onset  . Heart attack Father   . Diabetes Neg Hx   . Hyperlipidemia Neg Hx   . Hypertension Neg Hx   . Sudden death Neg Hx     BP 136/70  Pulse 56  Ht 6' (1.829 m)  Wt 200 lb (90.719 kg)  BMI 27.12 kg/m2  Review of Systems  See HPI above.    Objective:   Physical Exam  Gen: NAD  L foot/ankle: Small nodule noted visibly and on palpation of achilles not at insertion.  No other deformity, swelling, bruising. FROM ankle. Mild TTP 2 cm proximal to achilles insertion on calcaneus.  No plantar fascia or other tenderness of foot/ankle. Negative ant drawer and talar tilt.   Negative calcaneal squeeze. Thompsons test negative. NV intact distally.    Assessment & Plan:  1. Left achilles tendinopathy - improving.  Continue nitro patches for either 3 or 6 months (at least 6 more weeks).  Continue home exercise program and eccentrics.  Avoid hills, uneven ground when running otherwise no restrictions.  F/u in 6 weeks  for reevaluation.

## 2012-11-01 NOTE — Patient Instructions (Addendum)
Continue current treatment including nitro. Follow up in 6 weeks.

## 2012-11-01 NOTE — Assessment & Plan Note (Signed)
Left achilles tendinopathy - improving.  Continue nitro patches for either 3 or 6 months (at least 6 more weeks).  Continue home exercise program and eccentrics.  Avoid hills, uneven ground when running otherwise no restrictions.  F/u in 6 weeks for reevaluation.

## 2012-12-13 ENCOUNTER — Ambulatory Visit: Payer: BC Managed Care – PPO | Admitting: Family Medicine

## 2016-02-10 ENCOUNTER — Emergency Department (HOSPITAL_COMMUNITY): Payer: BLUE CROSS/BLUE SHIELD

## 2016-02-10 ENCOUNTER — Encounter (HOSPITAL_COMMUNITY): Payer: Self-pay | Admitting: Emergency Medicine

## 2016-02-10 ENCOUNTER — Emergency Department (HOSPITAL_COMMUNITY)
Admission: EM | Admit: 2016-02-10 | Discharge: 2016-02-10 | Disposition: A | Discharge status: Home / Self Care | Payer: BLUE CROSS/BLUE SHIELD | Attending: Emergency Medicine | Admitting: Emergency Medicine

## 2016-02-10 DIAGNOSIS — R0789 Other chest pain: Secondary | ICD-10-CM | POA: Insufficient documentation

## 2016-02-10 DIAGNOSIS — R200 Anesthesia of skin: Secondary | ICD-10-CM | POA: Diagnosis not present

## 2016-02-10 DIAGNOSIS — F419 Anxiety disorder, unspecified: Secondary | ICD-10-CM | POA: Diagnosis not present

## 2016-02-10 DIAGNOSIS — R079 Chest pain, unspecified: Secondary | ICD-10-CM

## 2016-02-10 HISTORY — DX: Anxiety disorder, unspecified: F41.9

## 2016-02-10 LAB — CBC
HEMATOCRIT: 40.2 % (ref 39.0–52.0)
HEMOGLOBIN: 13.9 g/dL (ref 13.0–17.0)
MCH: 31 pg (ref 26.0–34.0)
MCHC: 34.6 g/dL (ref 30.0–36.0)
MCV: 89.7 fL (ref 78.0–100.0)
Platelets: 270 10*3/uL (ref 150–400)
RBC: 4.48 MIL/uL (ref 4.22–5.81)
RDW: 12.1 % (ref 11.5–15.5)
WBC: 8.2 10*3/uL (ref 4.0–10.5)

## 2016-02-10 LAB — BASIC METABOLIC PANEL
ANION GAP: 9 (ref 5–15)
BUN: 15 mg/dL (ref 6–20)
CHLORIDE: 106 mmol/L (ref 101–111)
CO2: 22 mmol/L (ref 22–32)
Calcium: 9.4 mg/dL (ref 8.9–10.3)
Creatinine, Ser: 1.24 mg/dL (ref 0.61–1.24)
GFR calc Af Amer: >60 mL/min (ref >60)
GLUCOSE: 155 mg/dL — AB (ref 65–99)
POTASSIUM: 3.2 mmol/L — AB (ref 3.5–5.1)
Sodium: 137 mmol/L (ref 135–145)

## 2016-02-10 LAB — I-STAT TROPONIN, ED: Troponin i, poc: 0 ng/mL (ref 0.00–0.08)

## 2016-02-10 MED ORDER — SODIUM CHLORIDE 0.9 % IV BOLUS (SEPSIS): 500.0000 mL | Freq: Once | INTRAVENOUS | Status: DC

## 2016-02-10 MED ORDER — ACETAMINOPHEN 650 MG RE SUPP: 650.0000 mg | Freq: Once | RECTAL | Status: DC

## 2016-02-10 MED ORDER — SODIUM CHLORIDE 0.9 % IV SOLN: INTRAVENOUS | Status: DC

## 2016-02-10 NOTE — ED Triage Notes (Signed)
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

## 2016-02-10 NOTE — ED Notes (Signed)
Patient transported to X-ray 

## 2016-02-10 NOTE — Discharge Instructions (Signed)
Try to get plenty of rest, eat and drink regularly.  Work on stress reduction.  Return here, if needed, for problems.

## 2016-02-10 NOTE — ED Provider Notes (Signed)
[facility] DEPT Provider Note   CSN: [id number] Arrival date & time: 02/10/16  1340     History   Chief Complaint Chief Complaint  Patient presents with  . Chest Pain    HPI Cory Alexander is a 40 y.o. male.  He presents for evaluation of a sensation of nervousness associated with numbness and dizziness which started during a work meeting today. He also had the chest discomfort which felt like "heartburn". Sounds. Many times recently, mostly at night. He reports stress at work over the last couple of months. History of similar discomfort 5 years ago when he was treated for anxiety with Paxil. He exercises regularly without dizziness or chest pain. He runs about 20 miles each week. He denies recent fever, chills, cough, shortness of breath, nausea, vomiting, change in bowel or urinary habits. There are no other known modifying factors.  HPI  Past Medical History:  Diagnosis Date  . Anxiety   . Neuromuscular disorder (HCC)    L arm, bicep tendon injury, R hamstring injured     Patient Active Problem List   Diagnosis Date Noted  . Left Achilles tendinitis 09/21/2012    Past Surgical History:  Procedure Laterality Date  . APPENDECTOMY     2005  . DISTAL BICEPS TENDON REPAIR  02/08/2012   Procedure: DISTAL BICEPS TENDON REPAIR;  Surgeon: Cammy Copa, MD;  Location: [facility] OR;  Service: Orthopedics;  Laterality: Left;  Left distal biceps tendon repair  . FRACTURE SURGERY     06/2007- L wrist- repair  . REPAIR ANKLE LIGAMENT     cleaned out cartilage        Home Medications    Prior to Admission medications   Medication Sig Start Date End Date Taking? Authorizing Provider  nitroGLYCERIN (NITRODUR - DOSED IN MG/24 HR) 0.2 mg/hr 1/4th of a patch over affected achilles tendon, change daily 09/20/12   Lenda Kelp, MD    Family History Family History  Problem Relation Age of Onset  . Heart attack Father   . Diabetes Neg Hx   . Hyperlipidemia Neg Hx   .  Hypertension Neg Hx   . Sudden death Neg Hx     Social History Social History  Substance Use Topics  . Smoking status: Never Smoker  . Smokeless tobacco: Not on file  . Alcohol use Yes     Comment: social      Allergies   Review of patient's allergies indicates no known allergies.   Review of Systems Review of Systems  All other systems reviewed and are negative.    Physical Exam Updated Vital Signs BP 125/82   Pulse (!) 55   Temp 97.5 F (36.4 C) (Oral)   Resp 13   SpO2 100%   Physical Exam  Constitutional: He is oriented to person, place, and time. He appears well-developed and well-nourished.  HENT:  Head: Normocephalic and atraumatic.  Right Ear: External ear normal.  Left Ear: External ear normal.  Eyes: Conjunctivae and EOM are normal. Pupils are equal, round, and reactive to light.  Neck: Normal range of motion and phonation normal. Neck supple.  Cardiovascular: Normal rate, regular rhythm and normal heart sounds.   Pulmonary/Chest: Effort normal and breath sounds normal. He exhibits no bony tenderness.  Abdominal: Soft. There is no tenderness.  Musculoskeletal: Normal range of motion.  Neurological: He is alert and oriented to person, place, and time. No cranial nerve deficit or sensory deficit. He exhibits normal muscle tone. Coordination normal.  Skin: Skin is warm, dry and intact.  Psychiatric: He has a normal mood and affect. His behavior is normal. Judgment and thought content normal.  Nursing note and vitals reviewed.    ED Treatments / Results  Labs (all labs ordered are listed, but only abnormal results are displayed) Labs Reviewed  BASIC METABOLIC PANEL - Abnormal; Notable for the following:       Result Value   Potassium 3.2 (*)    Glucose, Bld 155 (*)    All other components within normal limits  CBC  I-STAT TROPOININ, ED    EKG  EKG Interpretation  Date/Time:  Tuesday February 10 2016 13:44:21 EDT Ventricular Rate:  85 PR  Interval:  144 QRS Duration: 108 QT Interval:  390 QTC Calculation: 464 R Axis:   100 Text Interpretation:  Normal sinus rhythm Rightward axis Borderline ECG No old tracing to compare Confirmed by [facility] North PinellasWENTZ  MD, Bethannie Iglehart [phone]([id number]) on 02/10/2016 3:00:45 PM Also confirmed by Effie ShyWENTZ  MD, Beatrix Breece [phone]), editor [place] CT, Jola BabinskiMarilyn [phone])  on 02/10/2016 3:28:31 PM       Radiology Dg Chest 2 View  Result Date: 02/10/2016 CLINICAL DATA:  Tachycardia and dizziness EXAM: CHEST  2 VIEW COMPARISON:  None. FINDINGS: The lungs are clear. The heart size and pulmonary vascularity are normal. No adenopathy. No pneumothorax. No bone lesions. There is focal atherosclerotic calcification in the aortic arch. IMPRESSION: No edema or consolidation.  There is aortic atherosclerosis. Electronically Signed   By: Bretta BangWilliam  Woodruff III M.D.   On: 02/10/2016 15:09    Procedures Procedures (including critical care time)  Medications Ordered in ED Medications - No data to display   Initial Impression / Assessment and Plan / ED Course  I have reviewed the triage vital signs and the nursing notes.  Pertinent labs & imaging results that were available during my care of the patient were reviewed by me and considered in my medical decision making (see chart for details).  Clinical Course    Medications - No data to display  Patient Vitals for the past 24 hrs:  BP Temp Temp src Pulse Resp SpO2  02/10/16 1620 137/78 - - (!) 53 12 100 %  02/10/16 1430 125/82 - - (!) 55 13 100 %  02/10/16 1350 127/75 97.5 F (36.4 C) Oral 78 26 100 %    4:16 PM Reevaluation with update and discussion. After initial assessment and treatment, an updated evaluation reveals No further complaints. Findings discussed with patient and wife, all questions answered. Darreld Hoffer L    Final Clinical Impressions(s) / ED Diagnoses   Final diagnoses:  Anxiety  Nonspecific chest pain  Numbness    Nonspecific symptoms, most likely related to  recurrent. Anxiety disorder with panic attack. Doubt ACS, , CVA, PE, pneumonia, or impending vascular collapse.   Nursing Notes Reviewed/ Care Coordinated Applicable Imaging Reviewed Interpretation of Laboratory Data incorporated into ED treatment  The patient appears reasonably screened and/or stabilized for discharge and I doubt any other medical condition or other [facility] requiring further screening, evaluation, or treatment in the ED at this time prior to discharge.  Plan: Home Medications- continue; Home Treatments- rest, stress management; return here if the recommended treatment, does not improve the symptoms; Recommended follow up- PCP prn and next Monday as scheduled.   New Prescriptions New Prescriptions   No medications on file     Mancel BaleElliott Llewelyn Sheaffer, MD 02/10/16 [phone]

## 2016-09-06 ENCOUNTER — Ambulatory Visit (INDEPENDENT_AMBULATORY_CARE_PROVIDER_SITE_OTHER): Payer: BLUE CROSS/BLUE SHIELD | Admitting: Family Medicine

## 2016-09-06 ENCOUNTER — Encounter: Payer: Self-pay | Admitting: Family Medicine

## 2016-09-06 DIAGNOSIS — M79601 Pain in right arm: Secondary | ICD-10-CM

## 2016-09-06 NOTE — Patient Instructions (Signed)
You have distal biceps tendinopathy and the musculotendinous junction. Start physical therapy and do home exercises on days you don't go to therapy ([facility] on [address]). Compression sleeve during the day. Heat or ice, whichever feels better at this point 15 minutes at a time 3-4 times a day. Ibuprofen  three times a day with food OR aleve 2 tabs twice a day with food for pain and inflammation - consider taking for 7-10 days regularly then as needed. Consider nitro patches if not improving. Follow up with me in 6 weeks for reevaluation.

## 2016-09-08 DIAGNOSIS — M79601 Pain in right arm: Secondary | ICD-10-CM | POA: Insufficient documentation

## 2016-09-08 NOTE — Assessment & Plan Note (Signed)
Right biceps tendinopathy, strain - start physical therapy and home exercises.  Compression sleeve.  Heat or ice.  Ibuprofen or aleve for 7-10 days then as needed.  Consider nitro patches if not improving.  F/u in 6 weeks.

## 2016-09-08 NOTE — Progress Notes (Signed)
PCP: Darrow Bussing, MD  Subjective:   HPI: Patient is a 41 y.o. male here for right arm pain.  Patient denies known injury or trauma. About 6-8 weeks ago he started to get pain anterior right upper arm. Noticed when picking up his kids. Worse even with lifting gallon of milk. Pain level is 4/10 but up to 6/10 at worst, sharp. No bruising. No skin changes, numbness. Is right handed.  Past Medical History:  Diagnosis Date  . Anxiety   . Neuromuscular disorder (HCC)    L arm, bicep tendon injury, R hamstring injured     No current outpatient prescriptions on file prior to visit.   No current facility-administered medications on file prior to visit.     Past Surgical History:  Procedure Laterality Date  . APPENDECTOMY     2005  . DISTAL BICEPS TENDON REPAIR  02/08/2012   Procedure: DISTAL BICEPS TENDON REPAIR;  Surgeon: Cammy Copa, MD;  Location: [facility] OR;  Service: Orthopedics;  Laterality: Left;  Left distal biceps tendon repair  . FRACTURE SURGERY     06/2007- L wrist- repair  . REPAIR ANKLE LIGAMENT     cleaned out cartilage     No Known Allergies  Social History   Social History  . Marital status: Married    Spouse name: N/A  . Number of children: N/A  . Years of education: N/A   Occupational History  . Not on file.   Social History Main Topics  . Smoking status: Never Smoker  . Smokeless tobacco: Never Used  . Alcohol use Yes     Comment: social   . Drug use: No  . Sexual activity: Not on file   Other Topics Concern  . Not on file   Social History Narrative  . No narrative on file    Family History  Problem Relation Age of Onset  . Heart attack Father   . Diabetes Neg Hx   . Hyperlipidemia Neg Hx   . Hypertension Neg Hx   . Sudden death Neg Hx     BP (!) 154/79   Pulse (!) 54   Ht 6' (1.829 m)   Wt 195 lb (88.5 kg)   BMI 26.45 kg/m   Review of Systems: See HPI above.     Objective:  Physical Exam:  Gen: NAD, comfortable in  exam room  Right arm/shoulder: No swelling, ecchymoses.  No gross deformity. TTP distal biceps including musculotendinous junction.  Tendon intact with hook test. FROM with mild pain on resisted elbow flexion and with pronation. Negative Speeds, Yergasons. NV intact distally.  Left arm: FROM elbow with 5/5 strength, no pain.  MSK u/s:  Right biceps tendon and muscle intact.  No evidence tear.  Some neovascularity in musculotendinous junction.  No other abnormalities.   Assessment & Plan:  1. Right biceps tendinopathy, strain - start physical therapy and home exercises.  Compression sleeve.  Heat or ice.  Ibuprofen or aleve for 7-10 days then as needed.  Consider nitro patches if not improving.  F/u in 6 weeks.

## 2016-09-15 ENCOUNTER — Ambulatory Visit (INDEPENDENT_AMBULATORY_CARE_PROVIDER_SITE_OTHER): Payer: Self-pay | Admitting: Orthopedic Surgery

## 2016-09-27 ENCOUNTER — Encounter: Payer: Self-pay | Admitting: Physical Therapy

## 2016-09-27 ENCOUNTER — Ambulatory Visit: Payer: BLUE CROSS/BLUE SHIELD | Attending: Family Medicine | Admitting: Physical Therapy

## 2016-09-27 DIAGNOSIS — M6281 Muscle weakness (generalized): Secondary | ICD-10-CM | POA: Diagnosis present

## 2016-09-27 DIAGNOSIS — M25521 Pain in right elbow: Secondary | ICD-10-CM

## 2016-09-27 DIAGNOSIS — M25621 Stiffness of right elbow, not elsewhere classified: Secondary | ICD-10-CM | POA: Diagnosis present

## 2016-09-27 NOTE — Therapy (Signed)
[facility] [address] [place], [place], [address] Phone: [phone]   Fax:  [phone]  Physical Therapy Evaluation  Patient Details  Name: Cory Alexander MRN: [id number] Date of Birth: 04/22/1976 Referring Provider: Dr Norton Blizzard   Encounter Date: 09/27/2016      PT End of Session - 09/27/16 1438    Visit Number 1   Number of Visits 16   Date for PT Re-Evaluation 11/22/16   Authorization Type BCBS    PT Start Time 1330   PT Stop Time 1419   PT Time Calculation (min) 49 min   Activity Tolerance Patient tolerated treatment well   Behavior During Therapy [facility] for tasks assessed/performed      Past Medical History:  Diagnosis Date  . Anxiety   . Neuromuscular disorder (HCC)    L arm, bicep tendon injury, R hamstring injured     Past Surgical History:  Procedure Laterality Date  . APPENDECTOMY     2005  . DISTAL BICEPS TENDON REPAIR  02/08/2012   Procedure: DISTAL BICEPS TENDON REPAIR;  Surgeon: Cammy Copa, MD;  Location: [facility] OR;  Service: Orthopedics;  Laterality: Left;  Left distal biceps tendon repair  . FRACTURE SURGERY     06/2007- L wrist- repair  . REPAIR ANKLE LIGAMENT     cleaned out cartilage     There were no vitals filed for this visit.       Subjective Assessment - 09/27/16 1336    Subjective Patient reports about 6-8 weeks ago he began having pain in his right bicpeps. He has pain when lifting objects like his children and a gallon of milk. He works in [employer]ing [employer].    Pertinent History Left bicpes rupture    Diagnostic tests Ultrasound to right bicpes: negative for tear    Patient Stated Goals to have less pain when perfroming daily activity    Currently in Pain? Yes   Pain Score 5    Pain Location Elbow  bicpes area    Pain Orientation Left   Pain Descriptors / Indicators Aching   Pain Type Acute pain   Pain Onset More than a month ago   Pain Frequency Constant   Aggravating Factors   bending the elbow; picking things up    Pain Relieving Factors rest,    Effect of Pain on Daily Activities difficulty picking items up and difficulty running             [facility] PT Assessment - 09/27/16 0001      Assessment   Medical Diagnosis Right Shoulder pain    Referring Provider Dr Norton Blizzard    Onset Date/Surgical Date --  8 weeks prior    Hand Dominance Right   Next MD Visit Nothing schduled    Prior Therapy None      Precautions   Precautions None     Restrictions   Weight Bearing Restrictions No     Balance Screen   Has the patient fallen in the past 6 months No   Has the patient had a decrease in activity level because of a fear of falling?  No   Is the patient reluctant to leave their home because of a fear of falling?  No     Home Environment   Additional Comments Has two children at home that he has to lift      Prior Function   Level of Independence Independent     Cognition   Overall  Cognitive Status Within Functional Limits for tasks assessed   Attention Focused   Focused Attention Appears intact   Memory Appears intact   Awareness Appears intact   Problem Solving Appears intact     Observation/Other Assessments   Focus on Therapeutic Outcomes (FOTO)  40% limitation      Sensation   Light Touch Not tested     Coordination   Gross Motor Movements are Fluid and Coordinated Yes   Fine Motor Movements are Fluid and Coordinated Yes     ROM / Strength   AROM / PROM / Strength AROM;PROM;Strength     AROM   Overall AROM Comments Has full range but pain felt when overpressure is applied to the bicpes area.      PROM   Overall PROM Comments pain with end range extension and supination      Strength   Strength Assessment Site Elbow   Right/Left Elbow Left;Right   Right Elbow Flexion 4+/5   Right Elbow Extension 5/5   Left Elbow Flexion 5/5   Left Elbow Extension 5/5     Palpation   Palpation comment tednerness to palpation  from lateral  elbow to mid elbow                    [facility] Adult PT Treatment/Exercise - 09/27/16 0001      Elbow Exercises   Other elbow exercises supination 2lb 2x10; eccentric bicpes x10; elbow extension stretch 3x25sec holds;      Manual Therapy   Manual therapy comments IASTYM to brachiradialus. Transverse friction to bicpes tendon; Supination stretching                 PT Education - 09/27/16 1436    Education provided Yes   Education Details HEP, symptom mangement, stretching    Person(s) Educated Patient   Methods Explanation;Demonstration;Tactile cues;Verbal cues   Comprehension Verbalized understanding;Returned demonstration;Verbal cues required;Tactile cues required          PT Short Term Goals - 09/27/16 1451      PT SHORT TERM GOAL #1   Title Patient will demsotrate full pain free right elbow extension    Time 4   Period Weeks   Status New     PT SHORT TERM GOAL #2   Title Patient will desmotrate full pain free left elbow supination    Time 4   Period Weeks   Status New     PT SHORT TERM GOAL #3   Title Patient will be independent with HEP for elbow and wrist strengthening and stretching    Time 4   Period Weeks   Status New           PT Long Term Goals - 09/27/16 1452      PT LONG TERM GOAL #1   Title Patient will lift up 8 lbs without pain in order to lift a gallon of milk    Time 8   Period Weeks   Status New     PT LONG TERM GOAL #2   Title Patient will demsotrate a 24 % limitation on FOTO    Time 8   Period Weeks   Status New     PT LONG TERM GOAL #3   Title Patient will run for 10 minutes without report of elbow pain    Time 8   Period Weeks   Status New               Plan -  09/27/16 1439    Clinical Impression Statement Patient is a 41 year old male with right bicpes pain. He has increased pain with flexion and supination of the elbow. His signs and symtomps are consistent with biceps tendonitis and possible  brachiraialus tedninits. He has tedneress to palpation in the bicpes tendons and into the lateral elbow. He has pain when he is running and pain when he is perfroming ADL's. He was given Ionto action patch to decrease inflammtion around bicpes. He would benefit from further skilled therapy.     Rehab Potential Good   PT Frequency 2x / week   PT Duration 8 weeks   PT Treatment/Interventions Cryotherapy;Electrical Stimulation;Iontophoresis /ml Dexamethasone;Functional mobility training;Moist Heat;Therapeutic activities;Therapeutic exercise;Neuromuscular re-education;Patient/family education;Passive range of motion;Manual techniques;Taping;Splinting;Energy conservation   PT Next Visit Plan continue with IASTYM and soft tissue mobilization. Continue Ionto, consider light bicpes and shoulder flexion strengthening activity. Consdier wrist strengthening.    PT Home Exercise Plan bicpes stretch; supination stretch, wrist extension stretch; wrist supination strengthening,    Consulted and Agree with Plan of Care Patient      Patient will benefit from skilled therapeutic intervention in order to improve the following deficits and impairments:  Pain, Decreased range of motion, Increased fascial restricitons, Decreased activity tolerance, Increased edema, Decreased strength  Visit Diagnosis: Pain in right elbow - Plan: PT plan of care cert/re-cert  Stiffness of right elbow, not elsewhere classified - Plan: PT plan of care cert/re-cert  Muscle weakness (generalized) - Plan: PT plan of care cert/re-cert     Problem List Patient Active Problem List   Diagnosis Date Noted  . Right arm pain 09/08/2016  . Left Achilles tendinitis 09/21/2012    Cory Alexander 09/27/2016, 3:15 PM  [facility] [address] [place], [place], [address] Phone: [phone]   Fax:  [phone]  Name: Cory Alexander MRN: [id number] Date of Birth: 1976-05-19

## 2016-09-27 NOTE — Addendum Note (Signed)
Addended by: Kathi Simpers F on: 09/27/2016 01:34 PM   Modules accepted: Orders

## 2016-10-07 ENCOUNTER — Ambulatory Visit: Payer: BLUE CROSS/BLUE SHIELD | Attending: Family Medicine | Admitting: Physical Therapy

## 2016-10-07 DIAGNOSIS — M25621 Stiffness of right elbow, not elsewhere classified: Secondary | ICD-10-CM | POA: Diagnosis present

## 2016-10-07 DIAGNOSIS — M25521 Pain in right elbow: Secondary | ICD-10-CM

## 2016-10-07 DIAGNOSIS — M6281 Muscle weakness (generalized): Secondary | ICD-10-CM | POA: Diagnosis present

## 2016-10-07 NOTE — Therapy (Signed)
[facility] [address] [place], [place], [address] Phone: [phone]   Fax:  [phone]  Physical Therapy Treatment  Patient Details  Name: Cory LeschesMark Alexander MRN: [id number] Date of Birth: 04/17/1976 Referring Provider: Dr Norton BlizzardShane [name]   Encounter Date: 10/07/2016      PT End of Session - 10/07/16 1042    Visit Number 2   Number of Visits 16   Date for PT Re-Evaluation 11/22/16   Authorization Type BCBS    PT Start Time 0801   PT Stop Time 0842   PT Time Calculation (min) 41 min   Activity Tolerance Patient tolerated treatment well   Behavior During Therapy [facility] for tasks assessed/performed      Past Medical History:  Diagnosis Date  . Anxiety   . Neuromuscular disorder (HCC)    L arm, bicep tendon injury, R hamstring injured     Past Surgical History:  Procedure Laterality Date  . APPENDECTOMY     2005  . DISTAL BICEPS TENDON REPAIR  02/08/2012   Procedure: DISTAL BICEPS TENDON REPAIR;  Surgeon: Cammy CopaGregory Scott Dean, MD;  [facility];  Service: Orthopedics;  Laterality: Left;  Left distal biceps tendon repair  . FRACTURE SURGERY     06/2007- L wrist- repair  . REPAIR ANKLE LIGAMENT     cleaned out cartilage     There were no vitals filed for this visit.      Subjective Assessment - 10/07/16 1030    Subjective Patient reports he continues to have some pain but it has improved. he is no longer having pain in his elbow. He has been working on all his exercises.    Pertinent History Left bicpes rupture    Diagnostic tests Ultrasound to right bicpes: negative for tear    Patient Stated Goals to have less pain when perfroming daily activity    Currently in Pain? Yes   Pain Score 3    Pain Location Elbow   Pain Orientation Left   Pain Descriptors / Indicators Aching   Pain Radiating Towards no radiation today    Pain Onset More than a month ago   Pain Frequency Constant   Aggravating Factors  bednbinbg the  elbow    Pain Relieving Factors rest    Effect of Pain on Daily Activities difficulty picking up items                          [facility] Adult PT Treatment/Exercise - 10/07/16 0001      Elbow Exercises   Other elbow exercises supination 2lb 2x10; eccentric bicpes x10; elbow extension stretch 3x25sec holds;      Shoulder Exercises: Standing   Other Standing Exercises scapular retraction 2x10 red; standing ER for throwing 2x10;    Other Standing Exercises standing shoulder flexion 2x10     Modalities   Modalities Ultrasound;Iontophoresis     Ultrasound   Ultrasound Location right lateral bicpes    Ultrasound Parameters 8 minutes; continuous ultrasound 1.5 w/cm2    Ultrasound Goals Pain;Edema     Iontophoresis   Type of Iontophoresis Dexamethasone   Location right biceps    Dose 1 cc    Time slow release patch      Manual Therapy   Manual therapy comments IASTYM to brachiradialus. Transverse friction to bicpes tendon; Supination stretching                   PT Short Term  Goals - 10/07/16 1046      PT SHORT TERM GOAL #1   Title Patient will demsotrate full pain free right elbow extension    Time 4   Period Weeks   Status On-going     PT SHORT TERM GOAL #2   Title Patient will desmotrate full pain free left elbow supination    Time 4   Period Weeks   Status On-going     PT SHORT TERM GOAL #3   Title Patient will be independent with HEP for elbow and wrist strengthening and stretching    Time 4   Period Weeks   Status On-going           PT Long Term Goals - 09/27/16 1452      PT LONG TERM GOAL #1   Title Patient will lift up 8 lbs without pain in order to lift a gallon of milk    Time 8   Period Weeks   Status New     PT LONG TERM GOAL #2   Title Patient will demsotrate a 24 % limitation on FOTO    Time 8   Period Weeks   Status New     PT LONG TERM GOAL #3   Title Patient will run for 10 minutes without report of elbow pain     Time 8   Period Weeks   Status New               Plan - 10/07/16 1043    Clinical Impression Statement Patient tolerated treatment well. Therapy added ultrasound to improve blood flow and promote healing. He was encoraged to continue stretching. He was given light exercises to work on at home. He may benefti from dry needling. The patient had a palpable trigger point in this lateral bicpes area. He will be assessed for dry needling next visit.    Rehab Potential Good   PT Frequency 2x / week   PT Duration 8 weeks   PT Treatment/Interventions Cryotherapy;Electrical Stimulation;Iontophoresis 4mg /ml Dexamethasone;Functional mobility training;Moist Heat;Therapeutic activities;Therapeutic exercise;Neuromuscular re-education;Patient/family education;Passive range of motion;Manual techniques;Taping;Splinting;Energy conservation   PT Next Visit Plan continue with IASTYM and soft tissue mobilization. Continue Ionto, consider light bicpes and shoulder flexion strengthening activity. Consdier wrist strengthening.    PT Home Exercise Plan bicpes stretch; supination stretch, wrist extension stretch; wrist supination strengthening,    Consulted and Agree with Plan of Care Patient      Patient will benefit from skilled therapeutic intervention in order to improve the following deficits and impairments:  Pain, Decreased range of motion, Increased fascial restricitons, Decreased activity tolerance, Increased edema, Decreased strength  Visit Diagnosis: Pain in right elbow  Stiffness of right elbow, not elsewhere classified  Muscle weakness (generalized)     Problem List Patient Active Problem List   Diagnosis Date Noted  . Right arm pain 09/08/2016  . Left Achilles tendinitis 09/21/2012    Cory Alexander PT DPT  10/07/2016, 10:53 AM  [facility] [address] [place], [place], [address] Phone: [phone]   Fax:   [phone]  Name: Cory Alexander MRN: [id number] Date of Birth: 03-Nov-1975

## 2016-10-14 ENCOUNTER — Ambulatory Visit: Payer: BLUE CROSS/BLUE SHIELD | Admitting: Physical Therapy

## 2016-10-14 ENCOUNTER — Encounter: Payer: Self-pay | Admitting: Physical Therapy

## 2016-10-14 DIAGNOSIS — M6281 Muscle weakness (generalized): Secondary | ICD-10-CM

## 2016-10-14 DIAGNOSIS — M25621 Stiffness of right elbow, not elsewhere classified: Secondary | ICD-10-CM

## 2016-10-14 DIAGNOSIS — M25521 Pain in right elbow: Secondary | ICD-10-CM

## 2016-10-14 NOTE — Patient Instructions (Signed)
Trigger Point Dry Needling  . What is Trigger Point Dry Needling (DN)? o DN is a physical therapy technique used to treat muscle pain and dysfunction. Specifically, DN helps deactivate muscle trigger points (muscle knots).  o A thin filiform needle is used to penetrate the skin and stimulate the underlying trigger point. The goal is for a local twitch response (LTR) to occur and for the trigger point to relax. No medication of any kind is injected during the procedure.   . What Does Trigger Point Dry Needling Feel Like?  o The procedure feels different for each individual patient. Some patients report that they do not actually feel the needle enter the skin and overall the process is not painful. Very mild bleeding may occur. However, many patients feel a deep cramping in the muscle in which the needle was inserted. This is the local twitch response.   Marland Kitchen. How Will I feel after the treatment? o Soreness is normal, and the onset of soreness may not occur for a few hours. Typically this soreness does not last longer than two days.  o Bruising is uncommon, however; ice can be used to decrease any possible bruising.  o In rare cases feeling tired or nauseous after the treatment is normal. In addition, your symptoms may get worse before they get better, this period will typically not last longer than 24 hours.   . What Can I do After My Treatment? o Increase your hydration by drinking more water for the next 24 hours. o You may place ice or heat on the areas treated that have become sore, however, do not use heat on inflamed or bruised areas. Heat often brings more relief post needling. o You can continue your regular activities, but vigorous activity is not recommended initially after the treatment for 24 hours. o DN is best combined with other physical therapy such as strengthening, stretching, and other therapies.    Garen LahLawrie Beardsley, PT Certified Exercise Expert for the Aging Adult  10/14/16 8:55  AM Phone: [phone] Fax: [phone]

## 2016-10-14 NOTE — Therapy (Signed)
[facility] [address] [place], [place], [address] Phone: [phone]   Fax:  [phone]  Physical Therapy Treatment  Patient Details  Name: Cory Alexander MRN: [id number] Date of Birth: 10-24-1975 Referring Provider: Dr Norton Blizzard   Encounter Date: 10/14/2016      PT End of Session - 10/14/16 0848    Visit Number 3   Number of Visits 16   Date for PT Re-Evaluation 11/22/16   Authorization Type BCBS    PT Start Time 0848   PT Stop Time 0940   PT Time Calculation (min) 52 min   Activity Tolerance Patient tolerated treatment well   Behavior During Therapy [facility] for tasks assessed/performed      Past Medical History:  Diagnosis Date  . Anxiety   . Neuromuscular disorder (HCC)    L arm, bicep tendon injury, R hamstring injured     Past Surgical History:  Procedure Laterality Date  . APPENDECTOMY     2005  . DISTAL BICEPS TENDON REPAIR  02/08/2012   Procedure: DISTAL BICEPS TENDON REPAIR;  Surgeon: Cammy Copa, MD;  Location: [facility] OR;  Service: Orthopedics;  Laterality: Left;  Left distal biceps tendon repair  . FRACTURE SURGERY     06/2007- L wrist- repair  . REPAIR ANKLE LIGAMENT     cleaned out cartilage     There were no vitals filed for this visit.      Subjective Assessment - 10/14/16 0848    Subjective Pt reports he continues to have pain with bending elbow,(demonstrates more pain with biceps than brachioradialis) 6/10 pain.  5/10 pain after dry needling.    Patient Stated Goals to have less pain when perfroming daily activity    Pain Score 6   when bending elbow  at rest 2/10   Pain Location Elbow   Pain Orientation Left   Pain Descriptors / Indicators Aching   Pain Type Acute pain   Pain Onset More than a month ago   Pain Frequency Constant                         [facility] Adult PT Treatment/Exercise - 10/14/16 7253      Elbow Exercises   Other elbow exercises Pt with elbow  flexion and extension with IASTYM for active stretching.  and iwth supination.  Also neural flossing with mediian, ulnar and radial flossing     Shoulder Exercises: Standing   Other Standing Exercises --   Other Standing Exercises standing shoulder flexion 2x10     Modalities   Modalities Ultrasound;Iontophoresis     Moist Heat Therapy   Number Minutes Moist Heat 10 Minutes   Moist Heat Location Elbow  over bicep and supinator     Ultrasound   Ultrasound Location right lateral biceps and supinator   Ultrasound Parameters 8 min, 1.5 w/cm2   Ultrasound Goals Pain;Edema     Iontophoresis   Type of Iontophoresis --   Location --   Dose --   Time --     Manual Therapy   Manual therapy comments IASTYM to brachiradialus. Transverse friction to bicpes tendon; Supination stretching           Trigger Point Dry Needling - 10/14/16 0853    Consent Given? Yes   Education Handout Provided Yes   Muscles Treated Upper Body --  left biceps, , bracioradialis , supinator/pronator,  PT Education - 10/14/16 0855    Education provided Yes   Education Details education on trigger point dry needling aftercare and precautians   Person(s) Educated Patient   Methods Explanation;Handout   Comprehension Verbalized understanding          PT Short Term Goals - 10/14/16 0940      PT SHORT TERM GOAL #1   Title Patient will demsotrate full pain free right elbow extension    Baseline Pt feels pain 5-6/10 with full flexion and extension of right elbow   Time 4   Period Weeks   Status On-going     PT SHORT TERM GOAL #2   Title Patient will desmotrate full pain free left elbow supination    Baseline Pt feels pain 5-6/10 with full flexion and extension of right elbow   Time 4   Period Weeks   Status On-going     PT SHORT TERM GOAL #3   Title Patient will be independent with HEP for elbow and wrist strengthening and stretching    Baseline given initial HEP that he is  compliant with   Time 4   Period Weeks   Status On-going           PT Long Term Goals - 09/27/16 1452      PT LONG TERM GOAL #1   Title Patient will lift up 8 lbs without pain in order to lift a gallon of milk    Time 8   Period Weeks   Status New     PT LONG TERM GOAL #2   Title Patient will demsotrate a 24 % limitation on FOTO    Time 8   Period Weeks   Status New     PT LONG TERM GOAL #3   Title Patient will run for 10 minutes without report of elbow pain    Time 8   Period Weeks   Status New               Plan - 10/14/16 0865    Clinical Impression Statement Pt was educated on benefits of trigger point dry needling aftercare and precautions.  Pt consented to TDN and was closely monitored throughout session.  Pt demonstrated pain with reisisted biceps and had specific trigger point and minimal with brachioradialis muscle.  Pt was educated on importance of strething and expecttations with TDN.  Pt is compliant with HEP   Rehab Potential Good   PT Frequency 2x / week   PT Duration 8 weeks   PT Treatment/Interventions Cryotherapy;Electrical Stimulation;Iontophoresis 4mg /ml Dexamethasone;Functional mobility training;Moist Heat;Therapeutic activities;Therapeutic exercise;Neuromuscular re-education;Patient/family education;Passive range of motion;Manual techniques;Taping;Splinting;Energy conservation   PT Next Visit Plan continue with IASTYM and soft tissue mobilization. Continue Ionto, consider light bicpes and shoulder flexion strengthening activity. Consdier wrist strengthening.  Assess TDN and goals next visit   PT Home Exercise Plan bicpes stretch; supination stretch, wrist extension stretch; wrist supination strengthening,    Consulted and Agree with Plan of Care Patient      Patient will benefit from skilled therapeutic intervention in order to improve the following deficits and impairments:  Pain, Decreased range of motion, Increased fascial restricitons,  Decreased activity tolerance, Increased edema, Decreased strength  Visit Diagnosis: Pain in right elbow  Stiffness of right elbow, not elsewhere classified  Muscle weakness (generalized)     Problem List Patient Active Problem List   Diagnosis Date Noted  . Right arm pain 09/08/2016  . Left Achilles tendinitis 09/21/2012  Garen Lah,  PT Certified Exercise Expert for the Aging Adult  10/14/16 9:47 AM Phone: [phone] Fax: [phone]  [facility] [address] [place], [place], [address] Phone: [phone]   Fax:  [phone]  Name: Cory LeschesMark Alexander MRN: [id number] Date of Birth: 11/03/1975

## 2016-10-18 ENCOUNTER — Ambulatory Visit: Payer: BLUE CROSS/BLUE SHIELD | Admitting: Family Medicine

## 2016-10-20 ENCOUNTER — Ambulatory Visit: Payer: BLUE CROSS/BLUE SHIELD | Admitting: Physical Therapy

## 2016-10-20 ENCOUNTER — Encounter: Payer: Self-pay | Admitting: Physical Therapy

## 2016-10-20 DIAGNOSIS — M25521 Pain in right elbow: Secondary | ICD-10-CM

## 2016-10-20 DIAGNOSIS — M6281 Muscle weakness (generalized): Secondary | ICD-10-CM

## 2016-10-20 DIAGNOSIS — M25621 Stiffness of right elbow, not elsewhere classified: Secondary | ICD-10-CM

## 2016-10-20 NOTE — Therapy (Signed)
[facility] [address] [place], [place], [address] Phone: [phone]   Fax:  [phone]  Physical Therapy Treatment  Patient Details  Name: Cory Alexander MRN: [id number] Date of Birth: Oct 17, 1975 Referring Provider: Dr Norton Blizzard   Encounter Date: 10/20/2016      PT End of Session - 10/20/16 0848    Visit Number 4   Number of Visits 16   Date for PT Re-Evaluation 11/22/16   PT Start Time 0801   PT Stop Time 0845   PT Time Calculation (min) 44 min   Activity Tolerance Patient tolerated treatment well   Behavior During Therapy [facility] for tasks assessed/performed      Past Medical History:  Diagnosis Date  . Anxiety   . Neuromuscular disorder (HCC)    L arm, bicep tendon injury, R hamstring injured     Past Surgical History:  Procedure Laterality Date  . APPENDECTOMY     2005  . DISTAL BICEPS TENDON REPAIR  02/08/2012   Procedure: DISTAL BICEPS TENDON REPAIR;  Surgeon: Cammy Copa, MD;  Location: [facility] OR;  Service: Orthopedics;  Laterality: Left;  Left distal biceps tendon repair  . FRACTURE SURGERY     06/2007- L wrist- repair  . REPAIR ANKLE LIGAMENT     cleaned out cartilage     There were no vitals filed for this visit.      Subjective Assessment - 10/20/16 0805    Subjective "The TPDN made me sore but helped for a few days but it seems the pain is returning back, I think it is worse with picking up kids.    Currently in Pain? Yes   Pain Score 4    Pain Location Elbow   Pain Orientation Left   Aggravating Factors  picking up the kids, making a fist   Pain Relieving Factors rest                         [facility] Adult PT Treatment/Exercise - 10/20/16 0001      Elbow Exercises   Other elbow exercises supine controlled eccentric bicep curl with red theraband 1 x 15 with bicep brachii, 1 x 15 for brachioradialis   Other elbow exercises 3-way bicep strengthening against the wall 30 sec  hold ea.  with red theraband     Iontophoresis   Type of Iontophoresis Dexamethasone   Location right biceps    Dose 1 cc    Time 6 hour stat patch     Manual Therapy   Manual Therapy Joint mobilization;Soft tissue mobilization   Manual therapy comments --   Joint Mobilization lateral mobs with belt grade 3 with pt gripping ball, 1 set with pronation, 1 set in nuetral with thumb up and 1 set with supination   Soft tissue mobilization IASTM over the R mid to distal biceps, pin and stretching technique 2 x 10 using IASTM tools          Trigger Point Dry Needling - 10/20/16 0807    Consent Given? Yes   Education Handout Provided Yes   Muscles Treated Upper Body --  biceps brachii, brachialis, and brachioradialis on the R                PT Short Term Goals - 10/14/16 0940      PT SHORT TERM GOAL #1   Title Patient will demsotrate full pain free right elbow extension    Baseline Pt feels  pain 5-6/10 with full flexion and extension of right elbow   Time 4   Period Weeks   Status On-going     PT SHORT TERM GOAL #2   Title Patient will desmotrate full pain free left elbow supination    Baseline Pt feels pain 5-6/10 with full flexion and extension of right elbow   Time 4   Period Weeks   Status On-going     PT SHORT TERM GOAL #3   Title Patient will be independent with HEP for elbow and wrist strengthening and stretching    Baseline given initial HEP that he is compliant with   Time 4   Period Weeks   Status On-going           PT Long Term Goals - 09/27/16 1452      PT LONG TERM GOAL #1   Title Patient will lift up 8 lbs without pain in order to lift a gallon of milk    Time 8   Period Weeks   Status New     PT LONG TERM GOAL #2   Title Patient will demsotrate a 24 % limitation on FOTO    Time 8   Period Weeks   Status New     PT LONG TERM GOAL #3   Title Patient will run for 10 minutes without report of elbow pain    Time 8   Period Weeks    Status New               Plan - 10/20/16 0848    Clinical Impression Statement pt reported reduce muscle tightness and pain today. continued TPDN over the distabl biceps brachii, brachialis and brachioradialis. followed TPDN with IASTM techniques and mobs with belt. continued IONTO post session for soreness. he reported pain dropped to 2/10 and he just felt fatigued.   PT Next Visit Plan assess response to TPDN, continue with IASTYM and soft tissue mobilization. Continue Ionto, consider light bicpes and shoulder flexion strengthening activity. Consdier wrist strengthening.  Assess TDN and goals next visit   Consulted and Agree with Plan of Care Patient      Patient will benefit from skilled therapeutic intervention in order to improve the following deficits and impairments:  Pain, Decreased range of motion, Increased fascial restricitons, Decreased activity tolerance, Increased edema, Decreased strength  Visit Diagnosis: Pain in right elbow  Stiffness of right elbow, not elsewhere classified  Muscle weakness (generalized)     Problem List Patient Active Problem List   Diagnosis Date Noted  . Right arm pain 09/08/2016  . Left Achilles tendinitis 09/21/2012   Lulu RidingKristoffer Aston Lawhorn PT, DPT, LAT, ATC  10/20/16  8:51 AM      [facility] [address] [place], [place], [address] Phone: [phone]   Fax:  [phone]  Name: Cory LeschesMark Alexander MRN: [id number] Date of Birth: 11/29/1975

## 2016-10-22 ENCOUNTER — Ambulatory Visit: Payer: BLUE CROSS/BLUE SHIELD | Admitting: Physical Therapy

## 2016-10-22 ENCOUNTER — Encounter: Payer: Self-pay | Admitting: Physical Therapy

## 2016-10-22 DIAGNOSIS — M25521 Pain in right elbow: Secondary | ICD-10-CM | POA: Diagnosis not present

## 2016-10-22 DIAGNOSIS — M6281 Muscle weakness (generalized): Secondary | ICD-10-CM

## 2016-10-22 DIAGNOSIS — M25621 Stiffness of right elbow, not elsewhere classified: Secondary | ICD-10-CM

## 2016-10-22 NOTE — Therapy (Signed)
[facility] [address] [place], [place], [address] Phone: [phone]   Fax:  [phone]  Physical Therapy Treatment  Patient Details  Name: Cory Alexander MRN: [id number] Date of Birth: 09/21/1975 Referring Provider: Dr Norton BlizzardShane [name]   Encounter Date: 10/22/2016      PT End of Session - 10/22/16 0829    Visit Number 5   Number of Visits 16   Date for PT Re-Evaluation 11/22/16   Authorization Type BCBS    PT Start Time 0800   PT Stop Time 0842   PT Time Calculation (min) 42 min   Activity Tolerance Patient tolerated treatment well   Behavior [facility] for tasks assessed/performed      Past Medical History:  Diagnosis Date  . Anxiety   . Neuromuscular disorder (HCC)    L arm, bicep tendon injury, R hamstring injured     Past Surgical History:  Procedure Laterality Date  . APPENDECTOMY     2005  . DISTAL BICEPS TENDON REPAIR  02/08/2012   Procedure: DISTAL BICEPS TENDON REPAIR;  Surgeon: Cammy CopaGregory Scott Dean, MD;  [facility];  Service: Orthopedics;  Laterality: Left;  Left distal biceps tendon repair  . FRACTURE SURGERY     06/2007- L wrist- repair  . REPAIR ANKLE LIGAMENT     cleaned out cartilage     There were no vitals filed for this visit.      Subjective Assessment - 10/22/16 0821    Subjective Patient reports since he had the dry he has had nerve pain don his medial arm. The pain radiates down into his forearm    Pertinent History Left bicpes rupture    Diagnostic tests Ultrasound to right bicpes: negative for tear    Patient Stated Goals to have less pain when perfroming daily activity    Currently in Pain? Yes   Pain Score 8    Pain Location Elbow   Pain Orientation Left   Pain Descriptors / Indicators Aching   Pain Type Acute pain   Pain Onset More than a month ago   Pain Frequency Constant   Aggravating Factors  connstant nerve throbing    Pain Relieving Factors rest    Effect of  Pain on Daily Activities diffcifulty using the arm.                          [facility] Adult PT Treatment/Exercise - 10/22/16 0001      Elbow Exercises   Other elbow exercises biceps curl 2x10 with 5 lb; hammer curl 2x10 5 lb;    Other elbow exercises UE bike 2 min forward / 2 min back      Shoulder Exercises: Standing   Other Standing Exercises bicpes flexion      Modalities   Modalities Cryotherapy     Moist Heat Therapy   Number Minutes Moist Heat 4 Minutes   Moist Heat Location --  reproduced with treatment      Manual Therapy   Manual Therapy Joint mobilization;Soft tissue mobilization   Soft tissue mobilization IASTM over the R mid to distal biceps, pin and stretching technique 2 x 10 using IASTM tools                PT Education - 10/22/16 0829    Education provided Yes   Education Details stetch , ice and rest over the weekend    Person(s) Educated Patient   Methods Explanation;Handout  Comprehension Verbalized understanding          PT Short Term Goals - 10/14/16 0940      PT SHORT TERM GOAL #1   Title Patient will demsotrate full pain free right elbow extension    Baseline Pt feels pain 5-6/10 with full flexion and extension of right elbow   Time 4   Period Weeks   Status On-going     PT SHORT TERM GOAL #2   Title Patient will desmotrate full pain free left elbow supination    Baseline Pt feels pain 5-6/10 with full flexion and extension of right elbow   Time 4   Period Weeks   Status On-going     PT SHORT TERM GOAL #3   Title Patient will be independent with HEP for elbow and wrist strengthening and stretching    Baseline given initial HEP that he is compliant with   Time 4   Period Weeks   Status On-going           PT Long Term Goals - 09/27/16 1452      PT LONG TERM GOAL #1   Title Patient will lift up 8 lbs without pain in order to lift a gallon of milk    Time 8   Period Weeks   Status New     PT LONG TERM  GOAL #2   Title Patient will demsotrate a 24 % limitation on FOTO    Time 8   Period Weeks   Status New     PT LONG TERM GOAL #3   Title Patient will run for 10 minutes without report of elbow pain    Time 8   Period Weeks   Status New               Plan - 10/22/16 6295    Clinical Impression Statement Patient had increase nerve pain down his arm with bicpes stretching, He had no change with boicpes strengthening or soft tissue work. Palpable irritation felt in the medial bicpes and into the medial elbow. Therapy perfromed a short session and put ice on the patient. Next visit assess tolerance to last visit and proceed as tolerated.    Rehab Potential Good   PT Frequency 2x / week   PT Duration 8 weeks   PT Treatment/Interventions Cryotherapy;Electrical Stimulation;Iontophoresis 4mg /ml Dexamethasone;Functional mobility training;Moist Heat;Therapeutic activities;Therapeutic exercise;Neuromuscular re-education;Patient/family education;Passive range of motion;Manual techniques;Taping;Splinting;Energy conservation   PT Next Visit Plan assess response to TPDN, continue with IASTYM and soft tissue mobilization. Continue Ionto, consider light bicpes and shoulder flexion strengthening activity. Consdier wrist strengthening.  Assess TDN and goals next visit   PT Home Exercise Plan bicpes stretch; supination stretch, wrist extension stretch; wrist supination strengthening,    Consulted and Agree with Plan of Care Patient      Patient will benefit from skilled therapeutic intervention in order to improve the following deficits and impairments:  Pain, Decreased range of motion, Increased fascial restricitons, Decreased activity tolerance, Increased edema, Decreased strength  Visit Diagnosis: Pain in right elbow  Stiffness of right elbow, not elsewhere classified  Muscle weakness (generalized)     Problem List Patient Active Problem List   Diagnosis Date Noted  . Right arm pain  09/08/2016  . Left Achilles tendinitis 09/21/2012    [name]  10/22/2016, 12:00 PM  [facility] [address] [place], [place], [address] Phone: [phone]   Fax:  [phone]  Name: Cory Alexander MRN: [id number] Date of Birth: Jan 03, 1976

## 2016-11-02 ENCOUNTER — Ambulatory Visit: Payer: BLUE CROSS/BLUE SHIELD | Admitting: Family Medicine

## 2016-11-03 ENCOUNTER — Ambulatory Visit: Payer: BLUE CROSS/BLUE SHIELD | Admitting: Physical Therapy

## 2016-11-10 ENCOUNTER — Ambulatory Visit: Payer: BLUE CROSS/BLUE SHIELD | Attending: Family Medicine | Admitting: Physical Therapy

## 2016-11-10 ENCOUNTER — Encounter: Payer: Self-pay | Admitting: Physical Therapy

## 2016-11-10 DIAGNOSIS — M6281 Muscle weakness (generalized): Secondary | ICD-10-CM | POA: Insufficient documentation

## 2016-11-10 DIAGNOSIS — M25621 Stiffness of right elbow, not elsewhere classified: Secondary | ICD-10-CM | POA: Diagnosis present

## 2016-11-10 DIAGNOSIS — M25521 Pain in right elbow: Secondary | ICD-10-CM | POA: Diagnosis present

## 2016-11-10 NOTE — Therapy (Signed)
[facility] [address] [place], [place], [address] Phone: [phone]   Fax:  [phone]  Physical Therapy Treatment  Patient Details  Name: Cory LeschesMark Alexander MRN: [id number] Date of Birth: 07/01/1975 Referring Provider: Dr Norton BlizzardShane [name]   Encounter Date: 11/10/2016      PT End of Session - 11/10/16 1142    Visit Number 6   Number of Visits 16   Date for PT Re-Evaluation 11/22/16   Authorization Type BCBS    PT Start Time 1100   PT Stop Time 1144   PT Time Calculation (min) 44 min   Activity Tolerance Patient tolerated treatment well   Behavior During Therapy [facility] for tasks assessed/performed      Past Medical History:  Diagnosis Date  . Anxiety   . Neuromuscular disorder (HCC)    L arm, bicep tendon injury, R hamstring injured     Past Surgical History:  Procedure Laterality Date  . APPENDECTOMY     2005  . DISTAL BICEPS TENDON REPAIR  02/08/2012   Procedure: DISTAL BICEPS TENDON REPAIR;  Surgeon: Cammy CopaGregory Scott Dean, MD;  [facility];  Service: Orthopedics;  Laterality: Left;  Left distal biceps tendon repair  . FRACTURE SURGERY     06/2007- L wrist- repair  . REPAIR ANKLE LIGAMENT     cleaned out cartilage     There were no vitals filed for this visit.      Subjective Assessment - 11/10/16 1100    Subjective "doing about the same, Im still have soreness with running keeping the elbows bent"   Currently in Pain? Yes   Pain Score 5   with movement   Pain Location Elbow   Pain Orientation Right   Pain Type Chronic pain   Pain Onset More than a month ago   Pain Frequency Constant   Aggravating Factors  running   Pain Relieving Factors rest                         [facility] Adult PT Treatment/Exercise - 11/10/16 1122      Elbow Exercises   Other elbow exercises brachialis 2 x with 3 sec eccentric lengthening, with 3#  x20      Modalities   Modalities Electrical Stimulation;Iontophoresis      Programme researcher, broadcasting/film/videolectrical Stimulation   Electrical Stimulation Location R bicep Brachii and proximal common extensors   Electrical Stimulation Action IFC E-stim for DN   Electrical Stimulation Parameters Frequency at 18, intensity starting at 1st solid dot on both channels increasing gradually x 10 min    Electrical Stimulation Goals Pain  tightness     Iontophoresis   Type of Iontophoresis Dexamethasone   Location right biceps    Dose 1 cc    Time 6 hour stat patch     Manual Therapy   Soft tissue mobilization IASTM over the R mid to distal biceps, pin and stretching technique 2 x 10 using IASTM tools, tach and stretch techniques with active bicep contraction          Trigger Point Dry Needling - 11/10/16 1117    Consent Given? Yes   Education Handout Provided Yes   Muscles Treated Upper Body --  common extensor, and bicep brachii              PT Education - 11/10/16 1142    Education provided Yes   Education Details educating regarding E-stim with DN to fatigue the muscle.  Person(s) Educated Patient   Methods Explanation;Verbal cues   Comprehension Verbalized understanding;Verbal cues required          PT Short Term Goals - 10/14/16 0940      PT SHORT TERM GOAL #1   Title Patient will demsotrate full pain free right elbow extension    Baseline Pt feels pain 5-6/10 with full flexion and extension of right elbow   Time 4   Period Weeks   Status On-going     PT SHORT TERM GOAL #2   Title Patient will desmotrate full pain free left elbow supination    Baseline Pt feels pain 5-6/10 with full flexion and extension of right elbow   Time 4   Period Weeks   Status On-going     PT SHORT TERM GOAL #3   Title Patient will be independent with HEP for elbow and wrist strengthening and stretching    Baseline given initial HEP that he is compliant with   Time 4   Period Weeks   Status On-going           PT Long Term Goals - 09/27/16 1452      PT LONG TERM GOAL  #1   Title Patient will lift up 8 lbs without pain in order to lift a gallon of milk    Time 8   Period Weeks   Status New     PT LONG TERM GOAL #2   Title Patient will demsotrate a 24 % limitation on FOTO    Time 8   Period Weeks   Status New     PT LONG TERM GOAL #3   Title Patient will run for 10 minutes without report of elbow pain    Time 8   Period Weeks   Status New               Plan - 11/10/16 1142    Clinical Impression Statement pt reports improvement in radiating nerve pain down to the wrist but continues to have soreness in the bicep noted with running. Continued TPDN over the bicep brachii and common extensor, utilized e-stim with DN to facilitate a twitch to fatigue th emuscle and promote relaxation. Iastm techniques perofrmed with active release. continued ionto post session; pt reported minimal soreness from the DN but no pain.    PT Next Visit Plan assess response to TPDN with E-stim, continue with IASTYM and soft tissue mobilization. Continue Ionto, consider light bicpes and shoulder flexion strengthening activity. Consdier wrist strengthening.  Assess TDN and goals next visit   PT Home Exercise Plan bicpes stretch; supination stretch, wrist extension stretch; wrist supination strengthening,    Consulted and Agree with Plan of Care Patient      Patient will benefit from skilled therapeutic intervention in order to improve the following deficits and impairments:     Visit Diagnosis: Pain in right elbow  Stiffness of right elbow, not elsewhere classified  Muscle weakness (generalized)     Problem List Patient Active Problem List   Diagnosis Date Noted  . Right arm pain 09/08/2016  . Left Achilles tendinitis 09/21/2012   Lulu Riding PT, DPT, LAT, ATC  11/10/16  11:46 AM      [facility] [address] [place], [place], [address] Phone: [phone]   Fax:  [phone]  Name: Cory Alexander MRN: [id number] Date of Birth: 1975/09/21

## 2016-11-12 ENCOUNTER — Encounter: Payer: Self-pay | Admitting: Family Medicine

## 2016-11-12 ENCOUNTER — Ambulatory Visit (INDEPENDENT_AMBULATORY_CARE_PROVIDER_SITE_OTHER): Payer: BLUE CROSS/BLUE SHIELD | Admitting: Family Medicine

## 2016-11-12 DIAGNOSIS — M79601 Pain in right arm: Secondary | ICD-10-CM

## 2016-11-12 NOTE — Progress Notes (Signed)
PCP: Cory Alexander, Dibas, MD  Subjective:   HPI: Patient is a 41 y.o. male here for right arm pain.  4/2: Patient denies known injury or trauma. About 6-8 weeks ago he started to get pain anterior right upper arm. Noticed when picking up his kids. Worse even with lifting gallon of milk. Pain level is 4/10 but up to 6/10 at worst, sharp. No bruising. No skin changes, numbness. Is right handed.  6/8: Patient reports he is improving. Doing physical therapy and home exercises. Bothers mainly when trying to fully extend. Pain is more focal now within distal bicep area. Pain is 2/10, more dull now. No skin changes, numbness.  Past Medical History:  Diagnosis Date  . Anxiety   . Neuromuscular disorder (HCC)    L arm, bicep tendon injury, R hamstring injured     Current Outpatient Prescriptions on File Prior to Visit  Medication Sig Dispense Refill  . CIALIS 20 MG tablet     . PARoxetine (PAXIL) 10 MG tablet      No current facility-administered medications on file prior to visit.     Past Surgical History:  Procedure Laterality Date  . APPENDECTOMY     2005  . DISTAL BICEPS TENDON REPAIR  02/08/2012   Procedure: DISTAL BICEPS TENDON REPAIR;  Surgeon: Cammy CopaGregory Scott Dean, MD;  [facility];  Service: Orthopedics;  Laterality: Left;  Left distal biceps tendon repair  . FRACTURE SURGERY     06/2007- L wrist- repair  . REPAIR ANKLE LIGAMENT     cleaned out cartilage     No Known Allergies  Social History   Social History  . Marital status: Married    Spouse name: N/A  . Number of children: N/A  . Years of education: N/A   Occupational History  . Not on file.   Social History Main Topics  . Smoking status: Never Smoker  . Smokeless tobacco: Never Used  . Alcohol use Yes     Comment: social   . Drug use: No  . Sexual activity: Not on file   Other Topics Concern  . Not on file   Social History Narrative  . No narrative on file    Family History  Problem  Relation Age of Onset  . Heart attack Father   . Diabetes Neg Hx   . Hyperlipidemia Neg Hx   . Hypertension Neg Hx   . Sudden death Neg Hx     BP 128/76   Pulse (!) 54   Ht 6' (1.829 m)   Wt 195 lb (88.5 kg)   BMI 26.45 kg/m   Review of Systems: See HPI above.     Objective:  Physical Exam:  Gen: NAD, comfortable in exam room  Right arm/shoulder: No swelling, ecchymoses.  No gross deformity. TTP focally in distal lateral biceps.  Tendon intact with hook test. FROM with mild pain on resisted elbow flexion only now. Negative Speeds, Yergasons. NV intact distally.  Left arm: FROM elbow with 5/5 strength, no pain.   Assessment & Plan:  1. Right biceps tendinopathy, strain - Clinically improving with physical therapy and home exercises albeit slowly.  Encouraged to continue with these.  Heat/ice.  Ibuprofen or aleve only if needed.  As he's improving will hold off on trial of nitro patches.  F/u in 6 weeks or prn.

## 2016-11-12 NOTE — Patient Instructions (Signed)
You have distal biceps tendinopathy and the musculotendinous junction. Continue physical therapy and do home exercises on days you don't go to therapy - transition to home program only when you feel comfortable with this. Heat or ice, whichever feels better at this point 15 minutes at a time 3-4 times a day. Ibuprofen 600mg  three times a day with food OR aleve 2 tabs twice a day with food for pain and inflammation only if needed. Consider nitro patches if not improving. Follow up with me in 6 weeks or as needed.

## 2016-11-12 NOTE — Assessment & Plan Note (Signed)
Right biceps tendinopathy, strain - Clinically improving with physical therapy and home exercises albeit slowly.  Encouraged to continue with these.  Heat/ice.  Ibuprofen or aleve only if needed.  As he's improving will hold off on trial of nitro patches.  F/u in 6 weeks or prn.

## 2016-11-19 ENCOUNTER — Ambulatory Visit: Payer: BLUE CROSS/BLUE SHIELD | Admitting: Physical Therapy

## 2016-11-19 DIAGNOSIS — M25521 Pain in right elbow: Secondary | ICD-10-CM | POA: Diagnosis not present

## 2016-11-19 DIAGNOSIS — M6281 Muscle weakness (generalized): Secondary | ICD-10-CM

## 2016-11-19 DIAGNOSIS — M25621 Stiffness of right elbow, not elsewhere classified: Secondary | ICD-10-CM

## 2016-11-19 NOTE — Therapy (Addendum)
[place], [place], [address] Phone: [phone]   Fax:  [phone]  Physical Therapy Treatment  Patient Details  Name: Cory Alexander MRN: [id number] Date of Birth: 1976/05/12 Referring Provider: Dr Karlton Lemon   Encounter Date: 11/19/2016      PT End of Session - 11/19/16 1315    Visit Number 7   Number of Visits 16   Date for PT Re-Evaluation 11/22/16   Authorization Type BCBS    PT Start Time 0935   PT Stop Time 1015   PT Time Calculation (min) 40 min   Activity Tolerance Patient tolerated treatment well   Behavior During Therapy [facility] for tasks assessed/performed      Past Medical History:  Diagnosis Date  . Anxiety   . Neuromuscular disorder (HCC)    L arm, bicep tendon injury, R hamstring injured     Past Surgical History:  Procedure Laterality Date  . APPENDECTOMY     2005  . DISTAL BICEPS TENDON REPAIR  02/08/2012   Procedure: DISTAL BICEPS TENDON REPAIR;  Surgeon: Meredith Pel, MD;  Location: [facility];  Service: Orthopedics;  Laterality: Left;  Left distal biceps tendon repair  . FRACTURE SURGERY     06/2007- L wrist- repair  . REPAIR ANKLE LIGAMENT     cleaned out cartilage     There were no vitals filed for this visit.      Subjective Assessment - 11/19/16 1314    Subjective Patient reports this is about the best his elbow has felt. He has been running with miniaml pain.    Pertinent History Left bicpes rupture    Diagnostic tests Ultrasound to right bicpes: negative for tear    Patient Stated Goals to have less pain when perfroming daily activity    Currently in Pain? No/denies                         [facility] Adult PT Treatment/Exercise - 11/19/16 0001      Shoulder Exercises: Standing   Other Standing Exercises biceps curl palms up x20 10llb Hammer curls 10lb x7 6 lb x10      Shoulder Exercises: Power [employer] Limitations 45 lbs both handles;  could feel it with the left hand.    Other Power Writer 45 lbs  x20      Modalities   Modalities Cryotherapy     Cryotherapy   Number Minutes Cryotherapy 10 Minutes   Cryotherapy Location --  elbow   Type of Cryotherapy Ice pack     Iontophoresis   Type of Iontophoresis Dexamethasone   Location right biceps    Dose 1 cc    Time 6 hour stat patch     Manual Therapy   Manual Therapy Joint mobilization;Soft tissue mobilization   Soft tissue mobilization IASTM over the R mid to distal biceps, pin and stretching technique 2 x 10 using IASTM tools, tach and stretch techniques with active bicep contraction;                 PT Education - 11/19/16 1315    Education provided Yes   Education Details reviewed gym strengthening exercises    Person(s) Educated Patient   Methods Explanation;Demonstration   Comprehension Verbalized understanding;Returned demonstration;Tactile cues required;Verbal cues required          PT Short Term Goals - 10/14/16 0940      PT  SHORT TERM GOAL #1   Title Patient will demsotrate full pain free right elbow extension    Baseline Pt feels pain 5-6/10 with full flexion and extension of right elbow   Time 4   Period Weeks   Status On-going     PT SHORT TERM GOAL #2   Title Patient will desmotrate full pain free left elbow supination    Baseline Pt feels pain 5-6/10 with full flexion and extension of right elbow   Time 4   Period Weeks   Status On-going     PT SHORT TERM GOAL #3   Title Patient will be independent with HEP for elbow and wrist strengthening and stretching    Baseline given initial HEP that he is compliant with   Time 4   Period Weeks   Status On-going           PT Long Term Goals - 11/19/16 1317      PT LONG TERM GOAL #1   Title Patient will lift up 8 lbs without pain in order to lift a gallon of milk    Baseline lifting with minor pain    Time 8   Period Weeks   Status Partially Met      PT LONG TERM GOAL #2   Title Patient will demsotrate a 24 % limitation on FOTO    Baseline 31% limitation    Time 8   Period Weeks   Status On-going     PT LONG TERM GOAL #3   Title Patient will run for 10 minutes without report of elbow pain    Baseline musch less pain    Time 8   Period Weeks   Status On-going               Plan - 11/19/16 1316    Clinical Impression Statement Patient is making good progress. therapy reviewed strengthening for the biceps. he was encouraged to continue strengthening at home. He continues to have some pain but has a good plan to manage his pain and improve his strength. He will attmept a program at home on his onw. If he does well therapy will discharge him. If he needs he may come back for 1 more visit..Improved FOTO score   Clinical Presentation Stable   Clinical Decision Making Low   Rehab Potential Good   PT Frequency 2x / week   PT Duration 8 weeks   PT Treatment/Interventions Cryotherapy;Electrical Stimulation;Iontophoresis 63m/ml Dexamethasone;Functional mobility training;Moist Heat;Therapeutic activities;Therapeutic exercise;Neuromuscular re-education;Patient/family education;Passive range of motion;Manual techniques;Taping;Splinting;Energy conservation   PT Next Visit Plan assess response to TPDN with E-stim, continue with IASTYM and soft tissue mobilization. Continue Ionto, consider light bicpes and shoulder flexion strengthening activity. Consdier wrist strengthening.  Assess TDN and goals next visit   PT Home Exercise Plan bicpes stretch; supination stretch, wrist extension stretch; wrist supination strengthening,    Consulted and Agree with Plan of Care Patient      Patient will benefit from skilled therapeutic intervention in order to improve the following deficits and impairments:  Pain, Decreased range of motion, Increased fascial restricitons, Decreased activity tolerance, Increased edema, Decreased strength  Visit  Diagnosis: Pain in right elbow  Stiffness of right elbow, not elsewhere classified  Muscle weakness (generalized)  PHYSICAL THERAPY DISCHARGE SUMMARY  Visits from Start of Care: 7  Current functional level related to goals / functional outcomes: Continues to have pain but the pain has improved    Remaining deficits: Elbow pain at times  Education / Equipment: HEP  Plan: Patient agrees to discharge.  Patient goals were not met. Patient is being discharged due to meeting the stated rehab goals.  ?????        Problem List Patient Active Problem List   Diagnosis Date Noted  . Right arm pain 09/08/2016  . Left Achilles tendinitis 09/21/2012    Carney Living  PT DPT  11/19/2016, 1:19 PM  [facility] [address] [place], [place], [address] Phone: [phone]   Fax:  [phone]  Name: Cory Alexander MRN: [id number] Date of Birth: 1975-11-25

## 2016-12-27 ENCOUNTER — Encounter: Payer: Self-pay | Admitting: Physical Therapy

## 2017-01-03 ENCOUNTER — Ambulatory Visit: Payer: BLUE CROSS/BLUE SHIELD | Admitting: Family Medicine

## 2017-10-18 DIAGNOSIS — Z0000 Encounter for general adult medical examination without abnormal findings: Secondary | ICD-10-CM | POA: Diagnosis not present

## 2017-10-18 DIAGNOSIS — Z13220 Encounter for screening for lipoid disorders: Secondary | ICD-10-CM | POA: Diagnosis not present

## 2018-04-04 DIAGNOSIS — J0100 Acute maxillary sinusitis, unspecified: Secondary | ICD-10-CM | POA: Diagnosis not present

## 2018-04-04 DIAGNOSIS — R05 Cough: Secondary | ICD-10-CM | POA: Diagnosis not present

## 2018-08-25 DIAGNOSIS — H17811 Minor opacity of cornea, right eye: Secondary | ICD-10-CM | POA: Diagnosis not present

## 2018-09-22 ENCOUNTER — Other Ambulatory Visit: Payer: Self-pay | Admitting: Surgery

## 2018-09-22 DIAGNOSIS — R1031 Right lower quadrant pain: Secondary | ICD-10-CM

## 2018-09-22 DIAGNOSIS — N5082 Scrotal pain: Secondary | ICD-10-CM

## 2018-09-28 ENCOUNTER — Ambulatory Visit
Admission: RE | Admit: 2018-09-28 | Discharge: 2018-09-28 | Disposition: A | Discharge status: Home / Self Care | Payer: BLUE CROSS/BLUE SHIELD | Source: Ambulatory Visit | Attending: Surgery | Admitting: Surgery

## 2018-09-28 ENCOUNTER — Other Ambulatory Visit: Payer: Self-pay

## 2018-09-28 DIAGNOSIS — N5082 Scrotal pain: Secondary | ICD-10-CM | POA: Diagnosis not present

## 2018-09-28 DIAGNOSIS — R1031 Right lower quadrant pain: Secondary | ICD-10-CM

## 2018-10-09 ENCOUNTER — Ambulatory Visit: Payer: Self-pay | Admitting: Surgery

## 2018-11-13 DIAGNOSIS — Z13220 Encounter for screening for lipoid disorders: Secondary | ICD-10-CM | POA: Diagnosis not present

## 2018-11-13 DIAGNOSIS — Z23 Encounter for immunization: Secondary | ICD-10-CM | POA: Diagnosis not present

## 2018-11-13 DIAGNOSIS — Z0000 Encounter for general adult medical examination without abnormal findings: Secondary | ICD-10-CM | POA: Diagnosis not present

## 2018-12-12 ENCOUNTER — Other Ambulatory Visit: Payer: Self-pay | Admitting: *Deleted

## 2018-12-12 DIAGNOSIS — Z20822 Contact with and (suspected) exposure to covid-19: Secondary | ICD-10-CM

## 2018-12-15 LAB — NOVEL CORONAVIRUS, NAA: SARS-CoV-2, NAA: NOT DETECTED

## 2018-12-26 ENCOUNTER — Other Ambulatory Visit: Payer: Self-pay

## 2018-12-26 DIAGNOSIS — Z20828 Contact with and (suspected) exposure to other viral communicable diseases: Secondary | ICD-10-CM

## 2018-12-26 DIAGNOSIS — Z20822 Contact with and (suspected) exposure to covid-19: Secondary | ICD-10-CM

## 2018-12-29 LAB — NOVEL CORONAVIRUS, NAA: SARS-CoV-2, NAA: NOT DETECTED

## 2019-03-13 DIAGNOSIS — Z23 Encounter for immunization: Secondary | ICD-10-CM | POA: Diagnosis not present

## 2019-03-13 DIAGNOSIS — K4020 Bilateral inguinal hernia, without obstruction or gangrene, not specified as recurrent: Secondary | ICD-10-CM | POA: Diagnosis not present

## 2019-03-13 DIAGNOSIS — R109 Unspecified abdominal pain: Secondary | ICD-10-CM | POA: Diagnosis not present

## 2019-03-14 ENCOUNTER — Other Ambulatory Visit: Payer: Self-pay | Admitting: Family Medicine

## 2019-03-14 DIAGNOSIS — K4020 Bilateral inguinal hernia, without obstruction or gangrene, not specified as recurrent: Secondary | ICD-10-CM

## 2019-03-14 DIAGNOSIS — R109 Unspecified abdominal pain: Secondary | ICD-10-CM

## 2019-03-20 ENCOUNTER — Ambulatory Visit
Admission: RE | Admit: 2019-03-20 | Discharge: 2019-03-20 | Disposition: A | Discharge status: Home / Self Care | Payer: BC Managed Care – PPO | Source: Ambulatory Visit | Attending: Family Medicine | Admitting: Family Medicine

## 2019-03-20 ENCOUNTER — Other Ambulatory Visit: Payer: Self-pay

## 2019-03-20 DIAGNOSIS — R109 Unspecified abdominal pain: Secondary | ICD-10-CM

## 2019-03-20 DIAGNOSIS — Z0389 Encounter for observation for other suspected diseases and conditions ruled out: Secondary | ICD-10-CM | POA: Diagnosis not present

## 2019-03-20 DIAGNOSIS — K4020 Bilateral inguinal hernia, without obstruction or gangrene, not specified as recurrent: Secondary | ICD-10-CM

## 2019-03-20 MED ORDER — IOPAMIDOL (ISOVUE-300) INJECTION 61%
100.0000 mL | Freq: Once | INTRAVENOUS | Status: AC | PRN
  Administered 2019-03-20: 100 mL via INTRAVENOUS

## 2019-03-23 DIAGNOSIS — R1031 Right lower quadrant pain: Secondary | ICD-10-CM | POA: Diagnosis not present

## 2019-03-27 ENCOUNTER — Encounter: Payer: Self-pay | Admitting: Physical Therapy

## 2019-03-27 ENCOUNTER — Ambulatory Visit: Payer: BC Managed Care – PPO | Attending: Surgery | Admitting: Physical Therapy

## 2019-03-27 ENCOUNTER — Other Ambulatory Visit: Payer: Self-pay

## 2019-03-27 DIAGNOSIS — R1084 Generalized abdominal pain: Secondary | ICD-10-CM | POA: Insufficient documentation

## 2019-03-27 DIAGNOSIS — R252 Cramp and spasm: Secondary | ICD-10-CM | POA: Diagnosis not present

## 2019-03-27 DIAGNOSIS — M6281 Muscle weakness (generalized): Secondary | ICD-10-CM | POA: Diagnosis not present

## 2019-03-27 NOTE — Therapy (Signed)
[facility] [address] [place], [place], [address] Phone: [phone]   Fax:  [phone]  Physical Therapy Evaluation  Patient Details  Name: Cory LeschesMark Alexander MRN: [id number] Date of Birth: 07/11/1975 Referring Provider (PT): Dr. Harriette Bouillonhomas Cornett, MD   Encounter Date: 03/27/2019  PT End of Session - 03/27/19 1201    Visit Number  1    Date for PT Re-Evaluation  05/08/19    Authorization Type  BCBS    PT Start Time  1015    PT Stop Time  1100    PT Time Calculation (min)  45 min    Activity Tolerance  Patient tolerated treatment well;No increased pain    Behavior During Therapy  WFL for tasks assessed/performed       Past Medical History:  Diagnosis Date  . Anxiety   . Neuromuscular disorder (HCC)    L arm, bicep tendon injury, R hamstring injured     Past Surgical History:  Procedure Laterality Date  . APPENDECTOMY     2005  . DISTAL BICEPS TENDON REPAIR  02/08/2012   Procedure: DISTAL BICEPS TENDON REPAIR;  Surgeon: Cammy CopaGregory Scott Dean, MD;  [facility];  Service: Orthopedics;  Laterality: Left;  Left distal biceps tendon repair  . FRACTURE SURGERY     06/2007- L wrist- repair  . REPAIR ANKLE LIGAMENT     cleaned out cartilage     There were no vitals filed for this visit.   Subjective Assessment - 03/27/19 1026    Subjective  Patient reports his right groin pain started 08/2018 and stopped bowling now pain is gone. 10/2018 was running and changing in the car and bent over and had a sharp pain on right side of abdomen. No trouble with bowel movements or urination.    Patient Stated Goals  reduce pain    Currently in Pain?  Yes    Pain Score  6     Pain Location  Abdomen    Pain Orientation  Right    Pain Descriptors / Indicators  Dull;Sharp    Pain Type  Acute pain    Pain Onset  More than a month ago    Pain Frequency  Intermittent    Aggravating Factors   after activity, kayaking, when use the  abdomen, swimming    Pain Relieving Factors  sleep on left side,    Multiple Pain Sites  No         [facility] PT Assessment - 03/27/19 0001      Assessment   Medical Diagnosis  R10.31 right groin pain    Referring Provider (PT)  Dr. Harriette Bouillonhomas Cornett, MD    Onset Date/Surgical Date  10/06/18    Prior Therapy  none      Precautions   Precautions  None      Restrictions   Weight Bearing Restrictions  No      Balance Screen   Has the patient fallen in the past 6 months  No    Has the patient had a decrease in activity level because of a fear of falling?   No    Is the patient reluctant to leave their home because of a fear of falling?   No      Home Public house managernvironment   Living Environment  Private residence      Prior Function   Level of Independence  Independent    Vocation  Full time employment    Vocation Requirements  sitting    Leisure  swimming, exerciser, running, bowling, golfing      Cognition   Overall Cognitive Status  Within Functional Limits for tasks assessed      Posture/Postural Control   Posture/Postural Control  No significant limitations      ROM / Strength   AROM / PROM / Strength  AROM;PROM;Strength      AROM   Lumbar Flexion  lean to the left    Lumbar - Right Side Bend  decreased by 25% with no c-shape    Lumbar - Left Rotation  decreased by 25%      PROM   Right Hip External Rotation   25    Left Hip External Rotation   45      Strength   Right Hip Flexion  4/5   pain in righ tlide and lean trunk to the left   Right Hip Extension  4/5    Right Hip ABduction  4/5    Right Hip ADduction  4/5      Palpation   SI assessment   right ilium is anteriorly rotated, left rotation    Palpation comment  right iliopsoas, right side of umbilicus, right diaphragm                Objective measurements completed on examination: See above findings.      [facility] Adult PT Treatment/Exercise - 03/27/19 0001      Knee/Hip Exercises: Stretches    Piriformis Stretch  Right;1 rep;30 seconds      Manual Therapy   Manual Therapy  Joint mobilization;Soft tissue mobilization;Muscle Energy Technique    Joint Mobilization  P-A and rotational mobilization to T12-L5, sacral mobilization, left hip posterior glide    Soft tissue mobilization  right iliopsoas, lumbar parsapinals, quadratus, left abdominals    Muscle Energy Technique  corect right ilium        Trigger Point Dry Needling - 03/27/19 0001    Consent Given?  Yes    Education Handout Provided  Yes    Muscles Treated Back/Hip  Lumbar multifidi;Iliopsoas   right   Other Dry Needling  right side of umbilicus    Lumbar multifidi Response  Twitch response elicited;Palpable increased muscle length    Iliopsoas Response  Twitch response elicited;Palpable increased muscle length           PT Education - 03/27/19 1156    Education Details  information on dry needling    Person(s) Educated  Patient    Methods  Explanation;Handout    Comprehension  Verbalized understanding       PT Short Term Goals - 03/27/19 1207      PT SHORT TERM GOAL #1   Title  independent with initial HEP    Baseline  --    Time  3    Period  Weeks    Status  New    Target Date  04/17/19      PT SHORT TERM GOAL #2   Title  --    Baseline  --      PT SHORT TERM GOAL #3   Title  --    Baseline  --        PT Long Term Goals - 03/27/19 1208      PT LONG TERM GOAL #1   Title  independent with advanced HEP    Baseline  --    Time  6    Period  Weeks    Status  New    Target Date  05/08/19      PT LONG TERM GOAL #2   Title  pelvis in correct alignment and full lumbar ROM so he is able to sleep in any position without pain    Baseline  --    Time  6    Period  Weeks    Status  New    Target Date  05/22/19      PT LONG TERM GOAL #3   Title  able to return to his running with no pain due to decreased trigger points in the right abdomen    Baseline  --    Time  6    Period  Weeks     Status  New    Target Date  05/08/19             Plan - 03/27/19 1202    Clinical Impression Statement  Patient is a 43 year old male with right abdominal pain with activity at level 61/0 intermittently. Lumbar ROM for right  sidebending is 25% limited with no c curve, left rotation is limited by 25% and lumbar flexion is deviated to the left. Hip P/ROM for external rotation is 25 degrees on the right and 45 degrees on the left. Right ilium is rotated anteriorly and sacrum rotated left, Tenderness located on right iliopsoas, right side of umbilicus, and right diaphgram. Patient had no pain after the manual work and pelvis was in correct alignment and full right lumbar sidebending. Patient will benefit from skilled therapy to reduce pain and improve function.    Personal Factors and Comorbidities  Fitness    Examination-Activity Limitations  Sleep    Stability/Clinical Decision Making  Evolving/Moderate complexity    Clinical Decision Making  Low    Rehab Potential  Excellent    PT Frequency  1x / week    PT Duration  6 weeks    PT Treatment/Interventions  Cryotherapy;Electrical Stimulation;Iontophoresis 4mg /ml Dexamethasone;Moist Heat;Ultrasound;Therapeutic exercise;Therapeutic activities;Neuromuscular re-education;Patient/family education;Dry needling;Manual techniques;Taping;Spinal Manipulations;Joint Manipulations    PT Next Visit Plan  see how he did with dry needling, right hip strength, check pelvic alignment and lumbar, core strength, right hip stretches    Consulted and Agree with Plan of Care  Patient       Patient will benefit from skilled therapeutic intervention in order to improve the following deficits and impairments:  Decreased range of motion, Increased fascial restricitons, Pain, Decreased activity tolerance, Decreased strength  Visit Diagnosis: Generalized abdominal pain - Plan: PT plan of care cert/re-cert  Muscle weakness (generalized) - Plan: PT plan of care  cert/re-cert  Cramp and spasm - Plan: PT plan of care cert/re-cert     Problem List Patient Active Problem List   Diagnosis Date Noted  . Right arm pain 09/08/2016  . Left Achilles tendinitis 09/21/2012    09/23/2012, PT 03/27/19 12:12 PM   [facility] [address] [place], [place], [place] Phone: [phone]   Fax:  [phone]  Name: Rayshad Riviello MRN: Cory Alexander Date of Birth: [birth year]

## 2019-03-27 NOTE — Patient Instructions (Signed)

## 2019-04-03 ENCOUNTER — Encounter: Payer: Self-pay | Admitting: Physical Therapy

## 2019-04-03 ENCOUNTER — Ambulatory Visit: Payer: BC Managed Care – PPO | Admitting: Physical Therapy

## 2019-04-03 ENCOUNTER — Other Ambulatory Visit: Payer: Self-pay

## 2019-04-03 DIAGNOSIS — R1084 Generalized abdominal pain: Secondary | ICD-10-CM

## 2019-04-03 DIAGNOSIS — M6281 Muscle weakness (generalized): Secondary | ICD-10-CM

## 2019-04-03 DIAGNOSIS — R252 Cramp and spasm: Secondary | ICD-10-CM

## 2019-04-03 NOTE — Therapy (Signed)
[facility] [address] [place], [place], [address] Phone: [phone]   Fax:  [phone]  Physical Therapy Treatment  Patient Details  Name: Cory Alexander MRN: [id number] Date of Birth: 1975-11-21 Referring Provider (PT): Dr. Erroll Luna, MD   Encounter Date: 04/03/2019  PT End of Session - 04/03/19 1611    Visit Number  2    Date for PT Re-Evaluation  05/08/19    Authorization Type  BCBS    PT Start Time  2542    PT Stop Time  1608    PT Time Calculation (min)  38 min    Activity Tolerance  Patient tolerated treatment well;No increased pain    Behavior During Therapy  WFL for tasks assessed/performed       Past Medical History:  Diagnosis Date  . Anxiety   . Neuromuscular disorder (HCC)    L arm, bicep tendon injury, R hamstring injured     Past Surgical History:  Procedure Laterality Date  . APPENDECTOMY     2005  . DISTAL BICEPS TENDON REPAIR  02/08/2012   Procedure: DISTAL BICEPS TENDON REPAIR;  Surgeon: Meredith Pel, MD;  Location: [facility];  Service: Orthopedics;  Laterality: Left;  Left distal biceps tendon repair  . FRACTURE SURGERY     06/2007- L wrist- repair  . REPAIR ANKLE LIGAMENT     cleaned out cartilage     There were no vitals filed for this visit.  Subjective Assessment - 04/03/19 1533    Subjective  I felt good for several days but tightness is returning.    Patient Stated Goals  reduce pain    Currently in Pain?  Yes    Pain Score  3     Pain Location  Abdomen    Pain Orientation  Right    Pain Descriptors / Indicators  Dull;Sharp    Pain Type  Acute pain    Pain Onset  More than a month ago    Pain Frequency  Intermittent    Aggravating Factors   after activity, kayaking, when use the abdomen, swimming    Pain Relieving Factors  sleep on the left side    Multiple Pain Sites  No         [facility] PT Assessment - 04/03/19 0001      AROM   Overall AROM Comments   lateral glide of hip to the left caused a pinch on the right    Lumbar - Right Side Bend  decreased by 25% with  c-shape      Palpation   SI assessment   right ilium is anteriorly rotated, left rotation                   [facility] Adult PT Treatment/Exercise - 04/03/19 0001      Therapeutic Activites    Therapeutic Activities  Other Therapeutic Activities    Other Therapeutic Activities  sleeping with pillow to decrease torque on spine      Knee/Hip Exercises: Stretches   Other Knee/Hip Stretches  left hip flexor stretch in lunge position with left arm overhead, trunk rotational stretch bilateral ways      Manual Therapy   Manual Therapy  Soft tissue mobilization    Soft tissue mobilization  right iliopsoas, lumbar parsapinals, quadratus, left abdominals and left diaphgram       Trigger Point Dry Needling - 04/03/19 0001    Consent Given?  Yes  Education Handout Provided  Previously provided    Muscles Treated Back/Hip  Lumbar multifidi;Iliopsoas   right   Other Dry Needling  right side of umbilicus    Lumbar multifidi Response  Twitch response elicited;Palpable increased muscle length    Iliopsoas Response  Twitch response elicited;Palpable increased muscle length           PT Education - 04/03/19 1609    Education Details  Access Code: [email or web address]    Person(s) Educated  Patient    Methods  Explanation;Demonstration;Verbal cues;Handout    Comprehension  Verbalized understanding;Returned demonstration       PT Short Term Goals - 04/03/19 1615      PT SHORT TERM GOAL #1   Title  independent with initial HEP    Time  3    Period  Weeks    Status  On-going    Target Date  04/17/19        PT Long Term Goals - 03/27/19 1208      PT LONG TERM GOAL #1   Title  independent with advanced HEP    Baseline  --    Time  6    Period  Weeks    Status  New    Target Date  05/08/19      PT LONG TERM GOAL #2   Title  pelvis in correct alignment and full lumbar  ROM so he is able to sleep in any position without pain    Baseline  --    Time  6    Period  Weeks    Status  New    Target Date  05/22/19      PT LONG TERM GOAL #3   Title  able to return to his running with no pain due to decreased trigger points in the right abdomen    Baseline  --    Time  6    Period  Weeks    Status  New    Target Date  05/08/19            Plan - 04/03/19 1611    Clinical Impression Statement  After maual work pain level decreased to 0/10. Patient had trigger points in the left transverse abdominus, left quadratus, left iliopsoas, and left thoraco lumbar multifidi. Patient had some tightness for right sidebending but had a C curve. Patient was able to swim for 5 miles today without pain. Patient is now able to sit upright compared to leaning back to the right. Patient will benefit from skilled therapy to reduce pain and improve function.    Personal Factors and Comorbidities  Fitness    Examination-Activity Limitations  Sleep    Stability/Clinical Decision Making  Evolving/Moderate complexity    Rehab Potential  Excellent    PT Frequency  1x / week    PT Duration  6 weeks    PT Treatment/Interventions  Cryotherapy;Electrical Stimulation;Iontophoresis 4mg /ml Dexamethasone;Moist Heat;Ultrasound;Therapeutic exercise;Therapeutic activities;Neuromuscular re-education;Patient/family education;Dry needling;Manual techniques;Taping;Spinal Manipulations;Joint Manipulations    PT Next Visit Plan  right hip strength, check pelvic alignment and lumbar, core strength    PT Home Exercise Plan  Access Code: [email or web address]    Recommended Other Services  MD signed initial eval    Consulted and Agree with Plan of Care  Patient       Patient will benefit from skilled therapeutic intervention in order to improve the following deficits and impairments:  Decreased range of motion, Increased fascial restricitons, Pain, Decreased activity tolerance, Decreased strength  Visit  Diagnosis: Generalized abdominal pain  Muscle weakness (generalized)  Cramp and spasm     Problem List Patient Active Problem List   Diagnosis Date Noted  . Right arm pain 09/08/2016  . Left Achilles tendinitis 09/21/2012    Eulis Fosterheryl Jermond Burkemper, PT 04/03/19 4:16 PM   [facility] [address], [address] [place], [place], [address] Phone: [phone]   Fax:  [phone]  Name: Cory Alexander MRN: [id number] Date of Birth: 08/22/1975

## 2019-04-03 NOTE — Patient Instructions (Signed)
Access Code: [email or web address]  URL: [email or web address]  Date: 04/03/2019  Prepared by: Earlie Counts   Exercises Sidelying Thoracic and Shoulder Rotation - 2 reps - 1 sets - 30 sec hold - 1x daily - 7x weekly Half Kneeling Hip Flexor Stretch with Sidebend - 2 reps - 1 sets - 30 sec hold - 1x daily - 7x weekly [facility] [address] [place], [place] [address] Phone # [phone] Fax [phone]

## 2019-04-09 ENCOUNTER — Ambulatory Visit: Payer: BC Managed Care – PPO | Attending: Surgery | Admitting: Physical Therapy

## 2019-04-09 ENCOUNTER — Other Ambulatory Visit: Payer: Self-pay

## 2019-04-09 ENCOUNTER — Encounter: Payer: Self-pay | Admitting: Physical Therapy

## 2019-04-09 DIAGNOSIS — R252 Cramp and spasm: Secondary | ICD-10-CM

## 2019-04-09 DIAGNOSIS — R1084 Generalized abdominal pain: Secondary | ICD-10-CM | POA: Diagnosis not present

## 2019-04-09 DIAGNOSIS — M6281 Muscle weakness (generalized): Secondary | ICD-10-CM

## 2019-04-09 NOTE — Therapy (Signed)
[facility] [address], [address] [place], [place], [address] Phone: [phone]   Fax:  [phone]  Physical Therapy Treatment  Patient Details  Name: Cory Alexander MRN: [id number] Date of Birth: [birth year] Referring Provider (PT): Dr. Harriette Bouillon, MD   Encounter Date: 04/09/2019  PT End of Session - 04/09/19 1143    Visit Number  3    Date for PT Re-Evaluation  05/08/19    Authorization Type  BCBS    PT Start Time  1100    PT Stop Time  1143    PT Time Calculation (min)  43 min    Activity Tolerance  Patient tolerated treatment well;No increased pain    Behavior During Therapy  WFL for tasks assessed/performed       Past Medical History:  Diagnosis Date  . Anxiety   . Neuromuscular disorder (HCC)    L arm, bicep tendon injury, R hamstring injured     Past Surgical History:  Procedure Laterality Date  . APPENDECTOMY     2005  . DISTAL BICEPS TENDON REPAIR  02/08/2012   Procedure: DISTAL BICEPS TENDON REPAIR;  Surgeon: Cammy Copa, MD;  Location: [facility] OR;  Service: Orthopedics;  Laterality: Left;  Left distal biceps tendon repair  . FRACTURE SURGERY     06/2007- L wrist- repair  . REPAIR ANKLE LIGAMENT     cleaned out cartilage     There were no vitals filed for this visit.  Subjective Assessment - 04/09/19 1102    Subjective  I feel pretty tight in the right upper abdominal area. I have been working on the stretch. NO issue with sleep at this time.    Patient Stated Goals  reduce pain    Currently in Pain?  Yes    Pain Score  3     Pain Location  Abdomen    Pain Orientation  Right    Pain Descriptors / Indicators  Sharp;Dull    Pain Type  Acute pain    Pain Onset  More than a month ago    Pain Frequency  Intermittent    Aggravating Factors   2 hours afterrunning,    Pain Relieving Factors  stretch    Multiple Pain Sites  No         [facility] PT Assessment - 04/09/19 0001      AROM   Lumbar -  Right Side Bend  decreased by 25% with  c-shape                   [facility] Adult PT Treatment/Exercise - 04/09/19 0001      Therapeutic Activites    Therapeutic Activities  Other Therapeutic Activities;Work Dance movement psychotherapist  discussed with patient his work station to relax the right side of the abdominals    Other Therapeutic Activities  analized his running and keeps his right upper body back, tight upper trap, asing him to do more abdominal breathing      Modalities   Modalities  Ultrasound      Ultrasound   Ultrasound Location  right abdominals    Ultrasound Parameters  50%, 8 min, 0.8 w/cm2    Ultrasound Goals  Pain      Manual Therapy   Manual Therapy  Soft tissue mobilization;Joint mobilization;Myofascial release    Joint Mobilization  P-A and rotational mobilization to T5-L1    Soft tissue mobilization  right diaphragm, transverse abdominus  and around the  umbilicus    Myofascial Release  release around the umbilicus, tissue rolling of the upper abdomen               PT Short Term Goals - 04/09/19 1146      PT SHORT TERM GOAL #1   Title  independent with initial HEP    Time  3    Period  Weeks    Status  Achieved    Target Date  04/17/19        PT Long Term Goals - 03/27/19 1208      PT LONG TERM GOAL #1   Title  independent with advanced HEP    Baseline  --    Time  6    Period  Weeks    Status  New    Target Date  05/08/19      PT LONG TERM GOAL #2   Title  pelvis in correct alignment and full lumbar ROM so he is able to sleep in any position without pain    Baseline  --    Time  6    Period  Weeks    Status  New    Target Date  05/22/19      PT LONG TERM GOAL #3   Title  able to return to his running with no pain due to decreased trigger points in the right abdomen    Baseline  --    Time  6    Period  Weeks    Status  New    Target Date  05/08/19            Plan - 04/09/19 1144    Clinical Impression  Statement  Patient had some tightness in the lumbar right sidebending but after manaul work was full. Patient did not have abdominal trigger points after abdominal manual work. Patient is not having pain after swimming. Patient has tightness in the right diaphgram and around the umbilicus. Patient understands how to adjust his work station to reduce strain on the right abdominals. Patient will benefit from skilled therapy to reduce pian and improve function.    Personal Factors and Comorbidities  Fitness    Examination-Activity Limitations  Sleep    Stability/Clinical Decision Making  Evolving/Moderate complexity    Rehab Potential  Excellent    PT Frequency  1x / week    PT Duration  6 weeks    PT Treatment/Interventions  Cryotherapy;Electrical Stimulation;Iontophoresis 4mg /ml Dexamethasone;Moist Heat;Ultrasound;Therapeutic exercise;Therapeutic activities;Neuromuscular re-education;Patient/family education;Dry needling;Manual techniques;Taping;Spinal Manipulations;Joint Manipulations    PT Next Visit Plan  right hip strength, check pelvic alignment and lumbar, core strength; see how running went    PT Home Exercise Plan  Access Code: [email or web address]    Consulted and Agree with Plan of Care  Patient       Patient will benefit from skilled therapeutic intervention in order to improve the following deficits and impairments:  Decreased range of motion, Increased fascial restricitons, Pain, Decreased activity tolerance, Decreased strength  Visit Diagnosis: Generalized abdominal pain  Muscle weakness (generalized)  Cramp and spasm     Problem List Patient Active Problem List   Diagnosis Date Noted  . Right arm pain 09/08/2016  . Left Achilles tendinitis 09/21/2012    Earlie Counts, PT 04/09/19 11:48 AM   [facility] [address], [place], [place], [address] Phone: [phone]   Fax:  [phone]  Name: Cory Alexander MRN:  [id number] Date of Birth: [birth year]

## 2019-04-18 ENCOUNTER — Ambulatory Visit: Payer: BC Managed Care – PPO | Admitting: Physical Therapy

## 2019-04-18 ENCOUNTER — Encounter: Payer: Self-pay | Admitting: Physical Therapy

## 2019-04-18 ENCOUNTER — Other Ambulatory Visit: Payer: Self-pay

## 2019-04-18 DIAGNOSIS — R252 Cramp and spasm: Secondary | ICD-10-CM | POA: Diagnosis not present

## 2019-04-18 DIAGNOSIS — R1084 Generalized abdominal pain: Secondary | ICD-10-CM

## 2019-04-18 DIAGNOSIS — M6281 Muscle weakness (generalized): Secondary | ICD-10-CM

## 2019-04-18 NOTE — Patient Instructions (Signed)
Access Code: [email or web address]  URL: [email or web address]  Date: 04/18/2019  Prepared by: Earlie Counts   Exercises Sidelying Thoracic and Shoulder Rotation - 2 reps - 1 sets - 30 sec hold - 1x daily - 7x weekly Half Kneeling Hip Flexor Stretch with Sidebend - 2 reps - 1 sets - 30 sec hold - 1x daily - 7x weekly Supine Piriformis Stretch Pulling Heel to Hip - 2 reps - 1 sets - 30 sec hold - 1x daily - 7x weekly Supine Single Knee to Chest Stretch - 1 reps - 1 sets - 30 sec hold                            - 1x daily - 7x weekly Supine Double Knee to Chest Modified - 1 reps - 1 sets - 30 sec hold - 1x daily - 7x weekly Supine Hamstring Stretch - 1 reps - 1 sets - 30 sec hold - 1x daily - 7x weekly Modified Sidelying Quadriceps Stretch - 1 reps - 1 sets - 30 sec hold - 1x daily - 7x weekly Standing ITB Stretch - 1 reps - 1 sets - 15 sec hold - 1x daily - 7x weekly Supine March - 20 reps - 1 sets - 1x daily - 7x weekly Dead Bug - 2 reps - 1 sets - 1x daily - 7x weekly Standard Plank - 5 reps - 1 sets - 10 sec hold - 1x daily - 7x weekly [facility] [address], [place], [place] [address] Phone # [phone] Fax [phone]

## 2019-04-18 NOTE — Therapy (Signed)
[facility] [address] [place], [place], [address] Phone: [phone]   Fax:  [phone]  Physical Therapy Treatment  Patient Details  Name: Cory Alexander MRN: [id number] Date of Birth: Aug 27, 1975 Referring Provider (PT): Dr. Harriette Bouillon, MD   Encounter Date: 04/18/2019  PT End of Session - 04/18/19 0844    Visit Number  4    Date for PT Re-Evaluation  05/08/19    Authorization Type  BCBS    PT Start Time  0845    PT Stop Time  0923    PT Time Calculation (min)  38 min    Activity Tolerance  Patient tolerated treatment well;No increased pain    Behavior During Therapy  WFL for tasks assessed/performed       Past Medical History:  Diagnosis Date  . Anxiety   . Neuromuscular disorder (HCC)    L arm, bicep tendon injury, R hamstring injured     Past Surgical History:  Procedure Laterality Date  . APPENDECTOMY     2005  . DISTAL BICEPS TENDON REPAIR  02/08/2012   Procedure: DISTAL BICEPS TENDON REPAIR;  Surgeon: Cammy Copa, MD;  Location: [facility] OR;  Service: Orthopedics;  Laterality: Left;  Left distal biceps tendon repair  . FRACTURE SURGERY     06/2007- L wrist- repair  . REPAIR ANKLE LIGAMENT     cleaned out cartilage     There were no vitals filed for this visit.  Subjective Assessment - 04/18/19 0846    Subjective  I have one little spot left. No pain after running.    Patient Stated Goals  reduce pain    Currently in Pain?  Yes    Pain Score  4     Pain Location  Abdomen    Pain Orientation  Right    Pain Type  Acute pain    Pain Onset  More than a month ago    Pain Frequency  Intermittent    Aggravating Factors   2 hours after getting up and after running    Pain Relieving Factors  stretch    Multiple Pain Sites  No         [facility] PT Assessment - 04/18/19 0001      Palpation   SI assessment   pelvis in correct alignment                   [facility] Adult PT  Treatment/Exercise - 04/18/19 0001      Exercises   Exercises  Other Exercises    Other Exercises   plank on elbows holding for 10 seconds      Lumbar Exercises: Supine   Ab Set  5 reps;5 seconds    Bent Knee Raise  20 reps;1 second    Dead Bug  20 reps;1 second      Knee/Hip Exercises: Stretches   Active Hamstring Stretch  Right;Left;1 rep;30 seconds   supine   Hip Flexor Stretch  Right;Left;1 rep;30 seconds   sidely   ITB Stretch  Right;Left;1 rep;20 seconds   standing   Piriformis Stretch  Right;Left;1 rep;30 seconds    Piriformis Stretch Limitations  supine    Other Knee/Hip Stretches  single knee to chest 20 seconds each side      Manual Therapy   Manual Therapy  Joint mobilization;Soft tissue mobilization    Joint Mobilization  lower rib mobilization to assist the abdominals to contract correctly    Soft tissue  mobilization  right diaphragm and right transverse abdominus             PT Education - 04/18/19 0922    Education Details  Access Code: [email or web address]    Person(s) Educated  Patient    Methods  Explanation;Demonstration;Verbal cues;Handout    Comprehension  Verbalized understanding;Returned demonstration       PT Short Term Goals - 04/09/19 1146      PT SHORT TERM GOAL #1   Title  independent with initial HEP    Time  3    Period  Weeks    Status  Achieved    Target Date  04/17/19        PT Long Term Goals - 04/18/19 0925      PT LONG TERM GOAL #1   Title  independent with advanced HEP    Period  Weeks    Status  On-going      PT LONG TERM GOAL #2   Title  pelvis in correct alignment and full lumbar ROM so he is able to sleep in any position without pain    Time  6    Period  Weeks    Status  On-going      PT LONG TERM GOAL #3   Title  able to return to his running with no pain due to decreased trigger points in the right abdomen    Time  6    Period  Weeks    Status  On-going            Plan - 04/18/19 0844    Clinical  Impression Statement  Patient wakes up due to hip pain not abdominal pain. Patient is having tightness in his hips. Patient is now only having pinpoint pain in the right lower transverse abdominus. Patient has difficulty with contracting the abdominals correctly with engaging the upper and lower. Patient will get pain in abdomen 2 hours after he gets up in the morning and after swimming. Patient will benefit from skilled therapy to reduce pain and improve function.    Personal Factors and Comorbidities  Fitness    Stability/Clinical Decision Making  Evolving/Moderate complexity    Rehab Potential  Excellent    PT Frequency  1x / week    PT Duration  6 weeks    PT Treatment/Interventions  Cryotherapy;Electrical Stimulation;Iontophoresis 4mg /ml Dexamethasone;Moist Heat;Ultrasound;Therapeutic exercise;Therapeutic activities;Neuromuscular re-education;Patient/family education;Dry needling;Manual techniques;Taping;Spinal Manipulations;Joint Manipulations    PT Next Visit Plan  right hip strength, core strength; assess the right lower transverse abdominus    PT Home Exercise Plan  Access Code: [email or web address]    Consulted and Agree with Plan of Care  Patient       Patient will benefit from skilled therapeutic intervention in order to improve the following deficits and impairments:  Decreased range of motion, Increased fascial restricitons, Pain, Decreased activity tolerance, Decreased strength  Visit Diagnosis: Generalized abdominal pain  Muscle weakness (generalized)  Cramp and spasm     Problem List Patient Active Problem List   Diagnosis Date Noted  . Right arm pain 09/08/2016  . Left Achilles tendinitis 09/21/2012    Earlie Counts, PT 04/18/19 9:27 AM   [facility] [address] [place], [place], [address] Phone: [phone]   Fax:  [phone]  Name: Cory Alexander MRN: [id number] Date of Birth: 07-27-1975

## 2019-04-23 ENCOUNTER — Encounter: Payer: BC Managed Care – PPO | Admitting: Physical Therapy

## 2019-04-26 DIAGNOSIS — M9902 Segmental and somatic dysfunction of thoracic region: Secondary | ICD-10-CM | POA: Diagnosis not present

## 2019-04-26 DIAGNOSIS — M9901 Segmental and somatic dysfunction of cervical region: Secondary | ICD-10-CM | POA: Diagnosis not present

## 2019-04-26 DIAGNOSIS — M9903 Segmental and somatic dysfunction of lumbar region: Secondary | ICD-10-CM | POA: Diagnosis not present

## 2019-04-26 DIAGNOSIS — M5442 Lumbago with sciatica, left side: Secondary | ICD-10-CM | POA: Diagnosis not present

## 2019-04-27 DIAGNOSIS — M9902 Segmental and somatic dysfunction of thoracic region: Secondary | ICD-10-CM | POA: Diagnosis not present

## 2019-04-27 DIAGNOSIS — M9901 Segmental and somatic dysfunction of cervical region: Secondary | ICD-10-CM | POA: Diagnosis not present

## 2019-04-27 DIAGNOSIS — M9903 Segmental and somatic dysfunction of lumbar region: Secondary | ICD-10-CM | POA: Diagnosis not present

## 2019-04-27 DIAGNOSIS — M5442 Lumbago with sciatica, left side: Secondary | ICD-10-CM | POA: Diagnosis not present

## 2019-04-30 ENCOUNTER — Encounter: Payer: Self-pay | Admitting: Physical Therapy

## 2019-04-30 ENCOUNTER — Ambulatory Visit: Payer: BC Managed Care – PPO | Admitting: Physical Therapy

## 2019-04-30 ENCOUNTER — Other Ambulatory Visit: Payer: Self-pay

## 2019-04-30 DIAGNOSIS — M6281 Muscle weakness (generalized): Secondary | ICD-10-CM

## 2019-04-30 DIAGNOSIS — R1084 Generalized abdominal pain: Secondary | ICD-10-CM

## 2019-04-30 DIAGNOSIS — R252 Cramp and spasm: Secondary | ICD-10-CM

## 2019-04-30 NOTE — Therapy (Signed)
[facility] [address] [place], [place], [address] Phone: [phone]   Fax:  [phone]  Physical Therapy Treatment  Patient Details  Name: Cory Alexander MRN: [id number] Date of Birth: [birth year] Referring Provider (PT): Dr. Erroll Luna, MD   Encounter Date: 04/30/2019  PT End of Session - 04/30/19 1101    Visit Number  5    Date for PT Re-Evaluation  05/08/19    Authorization Type  BCBS    PT Start Time  1100    PT Stop Time  1138    PT Time Calculation (min)  38 min    Activity Tolerance  Patient tolerated treatment well;No increased pain    Behavior During Therapy  WFL for tasks assessed/performed       Past Medical History:  Diagnosis Date  . Anxiety   . Neuromuscular disorder (HCC)    L arm, bicep tendon injury, R hamstring injured     Past Surgical History:  Procedure Laterality Date  . APPENDECTOMY     2005  . DISTAL BICEPS TENDON REPAIR  02/08/2012   Procedure: DISTAL BICEPS TENDON REPAIR;  Surgeon: Meredith Pel, MD;  Location: [place];  Service: Orthopedics;  Laterality: Left;  Left distal biceps tendon repair  . FRACTURE SURGERY     06/2007- L wrist- repair  . REPAIR ANKLE LIGAMENT     cleaned out cartilage     There were no vitals filed for this visit.  Subjective Assessment - 04/30/19 1101    Subjective  I have one spot. the back feels better and hips move better.    Patient Stated Goals  reduce pain    Currently in Pain?  Yes    Pain Score  5     Pain Location  Abdomen    Pain Orientation  Right    Pain Descriptors / Indicators  Sharp;Dull    Pain Type  Acute pain    Pain Onset  More than a month ago    Pain Frequency  Intermittent    Aggravating Factors   the pain comes in waves, wake up and move    Pain Relieving Factors  stretch    Multiple Pain Sites  No         [facility] PT Assessment - 04/30/19 0001      Assessment   Medical Diagnosis  R10.31 right groin pain    Referring Provider (PT)  Dr. Erroll Luna, MD    Onset Date/Surgical Date  10/06/18    Hand Dominance  Right    Prior Therapy  none      Precautions   Precautions  None      Restrictions   Weight Bearing Restrictions  No      Cognition   Overall Cognitive Status  Within Functional Limits for tasks assessed      Posture/Postural Control   Posture/Postural Control  No significant limitations      AROM   Lumbar Flexion  full    Lumbar - Right Side Bend  full    Lumbar - Left Rotation  full      Strength   Overall Strength Comments  single leg and double leg with no pain; Abdominal strength is 4+/5    Right Hip Flexion  5/5    Right Hip Extension  5/5    Right Hip ABduction  5/5    Right Hip ADduction  5/5      Palpation   SI assessment  pelvis in correct alignment                   [facility] Adult PT Treatment/Exercise - 04/30/19 0001      Ultrasound   Ultrasound Location  right abdominals     Ultrasound Goals  Pain      Manual Therapy   Manual Therapy  Soft tissue mobilization;Myofascial release    Soft tissue mobilization  deep soft tissue work to the left transverse abdominus, obliques along the fascial line in left sidey    Myofascial Release  fascial release along the seam where the transverse abdominus and obliques, went down the several layers monitoring the pin point pain in left sidely               PT Short Term Goals - 04/09/19 1146      PT SHORT TERM GOAL #1   Title  independent with initial HEP    Time  3    Period  Weeks    Status  Achieved    Target Date  04/17/19        PT Long Term Goals - 04/30/19 1240      PT LONG TERM GOAL #1   Title  independent with advanced HEP    Time  6    Period  Weeks    Status  Achieved      PT LONG TERM GOAL #2   Title  pelvis in correct alignment and full lumbar ROM so he is able to sleep in any position without pain    Time  6    Period  Weeks    Status  Achieved      PT LONG TERM  GOAL #3   Title  able to return to his running with no pain due to decreased trigger points in the right abdomen    Time  6    Period  Weeks    Status  Achieved            Plan - 04/30/19 1236    Clinical Impression Statement  Patient abdominal strength is 4+/5 with no pain eliciting with muscle testing. Patient pain is elicited with deep palpation along the right transverse abdominus ridge. After manual therapy the tenderness was less. Patient did have some thickness around the area. Patient has full lumbar ROM. Patient is able to do all of his physical activites without increased pain. Pain will happen randomly during the day.  Patient is independent with HEP.    Personal Factors and Comorbidities  Fitness    Examination-Activity Limitations  Sleep    Stability/Clinical Decision Making  Evolving/Moderate complexity    Rehab Potential  Excellent    PT Frequency  1x / week    PT Duration  6 weeks    PT Treatment/Interventions  Cryotherapy;Electrical Stimulation;Iontophoresis '4mg'$ /ml Dexamethasone;Moist Heat;Ultrasound;Therapeutic exercise;Therapeutic activities;Neuromuscular re-education;Patient/family education;Dry needling;Manual techniques;Taping;Spinal Manipulations;Joint Manipulations    PT Next Visit Plan  discharge to HEP    PT Home Exercise Plan  Access Code: [email or web address]    [email or web address] and Agree with Plan of Care  Patient       Patient will benefit from skilled therapeutic intervention in order to improve the following deficits and impairments:  Decreased range of motion, Increased fascial restricitons, Pain, Decreased activity tolerance, Decreased strength  Visit Diagnosis: Generalized abdominal pain  Muscle weakness (generalized)  Cramp and spasm     Problem List Patient Active Problem List   Diagnosis Date Noted  . Right arm  pain 09/08/2016  . Left Achilles tendinitis 09/21/2012    Earlie Counts, PT 04/30/19 12:42 PM   [facility] [address], [place], [place], [address] Phone: [phone]   Fax:  [phone]  Name: Cory Alexander MRN: [id number] Date of Birth: 1976-01-19  PHYSICAL THERAPY DISCHARGE SUMMARY  Visits from Start of Care: 5  Current functional level related to goals / functional outcomes: See above.    Remaining deficits: See above.    Education / Equipment: HEP Plan: Patient agrees to discharge.  Patient goals were met. Patient is being discharged due to meeting the stated rehab goals. Thank you for the referral. Earlie Counts, PT 04/30/19 12:43 PM   ?????

## 2019-05-01 DIAGNOSIS — M9901 Segmental and somatic dysfunction of cervical region: Secondary | ICD-10-CM | POA: Diagnosis not present

## 2019-05-01 DIAGNOSIS — M9903 Segmental and somatic dysfunction of lumbar region: Secondary | ICD-10-CM | POA: Diagnosis not present

## 2019-05-01 DIAGNOSIS — M9902 Segmental and somatic dysfunction of thoracic region: Secondary | ICD-10-CM | POA: Diagnosis not present

## 2019-05-01 DIAGNOSIS — M5442 Lumbago with sciatica, left side: Secondary | ICD-10-CM | POA: Diagnosis not present

## 2019-05-08 DIAGNOSIS — M9901 Segmental and somatic dysfunction of cervical region: Secondary | ICD-10-CM | POA: Diagnosis not present

## 2019-05-08 DIAGNOSIS — M9902 Segmental and somatic dysfunction of thoracic region: Secondary | ICD-10-CM | POA: Diagnosis not present

## 2019-05-08 DIAGNOSIS — M9903 Segmental and somatic dysfunction of lumbar region: Secondary | ICD-10-CM | POA: Diagnosis not present

## 2019-05-08 DIAGNOSIS — M5442 Lumbago with sciatica, left side: Secondary | ICD-10-CM | POA: Diagnosis not present

## 2019-05-14 DIAGNOSIS — M9903 Segmental and somatic dysfunction of lumbar region: Secondary | ICD-10-CM | POA: Diagnosis not present

## 2019-05-14 DIAGNOSIS — M9902 Segmental and somatic dysfunction of thoracic region: Secondary | ICD-10-CM | POA: Diagnosis not present

## 2019-05-14 DIAGNOSIS — M5442 Lumbago with sciatica, left side: Secondary | ICD-10-CM | POA: Diagnosis not present

## 2019-05-14 DIAGNOSIS — M9901 Segmental and somatic dysfunction of cervical region: Secondary | ICD-10-CM | POA: Diagnosis not present

## 2019-05-22 DIAGNOSIS — M9901 Segmental and somatic dysfunction of cervical region: Secondary | ICD-10-CM | POA: Diagnosis not present

## 2019-05-22 DIAGNOSIS — M9903 Segmental and somatic dysfunction of lumbar region: Secondary | ICD-10-CM | POA: Diagnosis not present

## 2019-05-22 DIAGNOSIS — M9902 Segmental and somatic dysfunction of thoracic region: Secondary | ICD-10-CM | POA: Diagnosis not present

## 2019-05-22 DIAGNOSIS — M5442 Lumbago with sciatica, left side: Secondary | ICD-10-CM | POA: Diagnosis not present

## 2019-05-25 DIAGNOSIS — R1031 Right lower quadrant pain: Secondary | ICD-10-CM | POA: Diagnosis not present

## 2019-05-29 DIAGNOSIS — M9903 Segmental and somatic dysfunction of lumbar region: Secondary | ICD-10-CM | POA: Diagnosis not present

## 2019-05-29 DIAGNOSIS — M5442 Lumbago with sciatica, left side: Secondary | ICD-10-CM | POA: Diagnosis not present

## 2019-05-29 DIAGNOSIS — M9901 Segmental and somatic dysfunction of cervical region: Secondary | ICD-10-CM | POA: Diagnosis not present

## 2019-05-29 DIAGNOSIS — M9902 Segmental and somatic dysfunction of thoracic region: Secondary | ICD-10-CM | POA: Diagnosis not present

## 2019-06-27 ENCOUNTER — Ambulatory Visit: Payer: 59 | Admitting: Family Medicine

## 2019-06-27 ENCOUNTER — Other Ambulatory Visit: Payer: Self-pay

## 2019-06-27 ENCOUNTER — Encounter: Payer: Self-pay | Admitting: Family Medicine

## 2019-06-27 VITALS — BP 142/78 | Ht 72.0 in | Wt 205.0 lb

## 2019-06-27 DIAGNOSIS — M25552 Pain in left hip: Secondary | ICD-10-CM

## 2019-06-27 MED ORDER — DICLOFENAC SODIUM 75 MG PO TBEC: 75.0000 mg | DELAYED_RELEASE_TABLET | Freq: Two times a day (BID) | ORAL | 1 refills | Status: DC

## 2019-06-27 NOTE — Patient Instructions (Signed)
Your pain is consistent with hip external rotator strain with secondary hip flexor strain, less likely labrum tear of your hip. Start physical therapy and do home exercises on days you don't go to therapy. Start diclofenac 75mg  twice a day with food. Ok to take tylenol with this. Activities as tolerated but avoid hills and stairs as much as possible. We will consider MR arthrogram of your hip if not improving but most people improve with therapy. Follow up with me in 6 weeks for reevaluation.

## 2019-06-27 NOTE — Progress Notes (Signed)
PCP: Lujean Amel, MD  Subjective:   HPI: Patient is a 44 y.o. male here for left hip pain.  Patient reports he was running more last year. He did an open water swim in October and thinks his current pain started after that. Pain has worsened since that time and having a hard time sleeping as a result. Pain is posterior hip and anterolateral hip. Has not run in 2.5 weeks. No numbness or tingling. No back pain. Has been doing stretches without relief.  Past Medical History:  Diagnosis Date  . Anxiety   . Neuromuscular disorder (HCC)    L arm, bicep tendon injury, R hamstring injured     Current Outpatient Medications on File Prior to Visit  Medication Sig Dispense Refill  . gabapentin (NEURONTIN) 300 MG capsule Take 300 mg by mouth daily as needed. Takes at nighttime occassionally.    Marland Kitchen PARoxetine (PAXIL) 10 MG tablet     . CIALIS 20 MG tablet      No current facility-administered medications on file prior to visit.    Past Surgical History:  Procedure Laterality Date  . APPENDECTOMY     2005  . DISTAL BICEPS TENDON REPAIR  02/08/2012   Procedure: DISTAL BICEPS TENDON REPAIR;  Surgeon: Meredith Pel, MD;  Location: [place];  Service: Orthopedics;  Laterality: Left;  Left distal biceps tendon repair  . FRACTURE SURGERY     06/2007- L wrist- repair  . REPAIR ANKLE LIGAMENT     cleaned out cartilage     No Known Allergies  Social History   Socioeconomic History  . Marital status: Married    Spouse name: Not on file  . Number of children: Not on file  . Years of education: Not on file  . Highest education level: Not on file  Occupational History  . Not on file  Tobacco Use  . Smoking status: Never Smoker  . Smokeless tobacco: Never Used  Substance and Sexual Activity  . Alcohol use: Yes    Comment: social   . Drug use: No  . Sexual activity: Not on file  Other Topics Concern  . Not on file  Social History Narrative  . Not on file   Social Determinants  of Health   Financial Resource Strain:   . Difficulty of Paying Living Expenses: Not on file  Food Insecurity:   . Worried About Charity fundraiser in the Last Year: Not on file  . Ran Out of Food in the Last Year: Not on file  Transportation Needs:   . Lack of Transportation (Medical): Not on file  . Lack of Transportation (Non-Medical): Not on file  Physical Activity:   . Days of Exercise per Week: Not on file  . Minutes of Exercise per Session: Not on file  Stress:   . Feeling of Stress : Not on file  Social Connections:   . Frequency of Communication with Friends and Family: Not on file  . Frequency of Social Gatherings with Friends and Family: Not on file  . Attends Religious Services: Not on file  . Active Member of Clubs or Organizations: Not on file  . Attends Archivist Meetings: Not on file  . Marital Status: Not on file  Intimate Partner Violence:   . Fear of Current or Ex-Partner: Not on file  . Emotionally Abused: Not on file  . Physically Abused: Not on file  . Sexually Abused: Not on file    Family History  Problem Relation Age of Onset  . Heart attack Father   . Diabetes Neg Hx   . Hyperlipidemia Neg Hx   . Hypertension Neg Hx   . Sudden death Neg Hx     BP (!) 142/78   Ht 6' (1.829 m)   Wt 205 lb (93 kg)   BMI 27.80 kg/m   Review of Systems: See HPI above.     Objective:  Physical Exam:  Gen: NAD, comfortable in exam room  Back: No gross deformity, scoliosis. No paraspinal TTP .  No midline or bony TTP. FROM without pain. Strength LEs 5/5 all muscle groups.   2+ MSRs in patellar and achilles tendons, equal bilaterally. Negative SLRs. Sensation intact to light touch bilaterally.  Left hip: No deformity. FROM with 5/5 strength. Tenderness to palpation over hip flexor, hip external rotators.  No trochanter, hamstring, other tenderness.Marland Kitchen NVI distally. Negative logroll Negative hop test Negative fabers and piriformis  stretches.   Assessment & Plan:  1. Left hip pain - 2/2 hip external rotator strain with hip flexor strain, less likely labral tear given current exam.  Start physical therapy, diclofenac.  Activities as tolerated but avoid hills and stairs when possible.  F/u in 6 weeks.

## 2019-07-02 ENCOUNTER — Other Ambulatory Visit: Payer: Self-pay

## 2019-07-02 ENCOUNTER — Ambulatory Visit: Payer: 59 | Attending: Surgery | Admitting: Physical Therapy

## 2019-07-02 ENCOUNTER — Encounter: Payer: Self-pay | Admitting: Physical Therapy

## 2019-07-02 DIAGNOSIS — M25552 Pain in left hip: Secondary | ICD-10-CM | POA: Diagnosis present

## 2019-07-02 DIAGNOSIS — M6281 Muscle weakness (generalized): Secondary | ICD-10-CM | POA: Diagnosis present

## 2019-07-02 NOTE — Therapy (Signed)
[place], [place], [address] Phone: [phone]   Fax:  [phone]  Physical Therapy Evaluation  Patient Details  Name: Cory Alexander MRN: [id number] Date of Birth: May 05, 1976 Referring Provider (PT): Dene Gentry, MD   Encounter Date: 07/02/2019  PT End of Session - 07/02/19 1538    Visit Number  1    Number of Visits  8    Date for PT Re-Evaluation  08/27/19    Authorization Type  Aetna    PT Start Time  1530    PT Stop Time  1615    PT Time Calculation (min)  45 min    Activity Tolerance  Patient tolerated treatment well    Behavior During Therapy  [facility] for tasks assessed/performed       Past Medical History:  Diagnosis Date  . Anxiety   . Neuromuscular disorder (HCC)    L arm, bicep tendon injury, R hamstring injured     Past Surgical History:  Procedure Laterality Date  . APPENDECTOMY     2005  . DISTAL BICEPS TENDON REPAIR  02/08/2012   Procedure: DISTAL BICEPS TENDON REPAIR;  Surgeon: Meredith Pel, MD;  Location: [facility];  Service: Orthopedics;  Laterality: Left;  Left distal biceps tendon repair  . FRACTURE SURGERY     06/2007- L wrist- repair  . REPAIR ANKLE LIGAMENT     cleaned out cartilage     There were no vitals filed for this visit.   Subjective Assessment - 07/02/19 1530    Subjective  Patient reports he has been running a lot more over the past year and he is he has been having pain deep in the back of the hip and in the front of the hip. Pain started after he did an open water swim on 02/06/2019. He tried chiropractic care and resting with no relief. The pain is not too bad when she is running but it will hurt after he is done running. He also has trouble sleeping because his hip, and he has tried different positioning and pillows between his hips.    Limitations  Other (comment)   running, sleeping   Patient Stated Goals  Get rid of hip pain and return to running  without limitation    Currently in Pain?  Yes    Pain Score  3    7/10 at worst   Pain Location  Hip    Pain Orientation  Left    Pain Descriptors / Indicators  Aching;Dull    Pain Type  Chronic pain    Pain Onset  More than a month ago    Pain Frequency  Intermittent    Aggravating Factors   Running, exercises, sleeping    Pain Relieving Factors  rest    Effect of Pain on Daily Activities  Patient is limited in running and sleeping.         [facility] PT Assessment - 07/02/19 0001      Assessment   Medical Diagnosis  Left hip pain    Referring Provider (PT)  Dene Gentry, MD    Onset Date/Surgical Date  02/06/19    Hand Dominance  Left    Next MD Visit  08/08/2019    Prior Therapy  Yes      Precautions   Precautions  None      Restrictions   Weight Bearing Restrictions  No      Balance Screen   Has  the patient fallen in the past 6 months  No      Home Public house manager residence    Living Arrangements  Spouse/significant other      Prior Function   Level of Independence  Independent    Leisure  Running, swimming, hiking, golfing      Cognition   Overall Cognitive Status  Within Functional Limits for tasks assessed      Observation/Other Assessments   Observations  Patient appears in no apparent distress    Focus on Therapeutic Outcomes (FOTO)   NA      Posture/Postural Control   Posture/Postural Control  No significant limitations      ROM / Strength   AROM / PROM / Strength  AROM;PROM;Strength      AROM   Overall AROM Comments  All lumbar motion WFL      PROM   Overall PROM Comments  Hip PROM grossly WFL, he did report concordant pain with left ER at 90 deg flexion    PROM Assessment Site  Hip    Right/Left Hip  Right;Left      Strength   Strength Assessment Site  Hip;Knee    Right/Left Hip  Right;Left    Right Hip Flexion  5/5    Right Hip Extension  4+/5    Right Hip External Rotation   4+/5    Right Hip Internal Rotation   4+/5    Right Hip ABduction  4/5    Left Hip Flexion  5/5    Left Hip Extension  4/5    Left Hip External Rotation  4/5    Left Hip Internal Rotation  4/5    Left Hip ABduction  4/5    Right/Left Knee  Right;Left    Right Knee Flexion  5/5    Right Knee Extension  5/5    Left Knee Flexion  5/5    Left Knee Extension  5/5      Flexibility   Soft Tissue Assessment /Muscle Length  yes    Hamstrings  WFL    Quadriceps  WFL    Piriformis  Left sided tightness      Palpation   Palpation comment  TTP just medial to posterior aspect of left hip greater trochanter      Special Tests    Special Tests  Hip Special Tests    Hip Special Tests   Luisa Hart (FABER) Test;Thomas Test;Ober's Test;Ely's Test;Hip Scouring;Anterior Hip Impingement Test      Luisa Hart [facility]) Test   Comments  Patient reported pulling sensation in posterior/lateral left hip      Thomas Test    Findings  Negative      Ober's Test   Findings  Negative      Ely's Test   Findings  Negative      Hip Scouring   Findings  Negative      Anterior Hip Impingement Test    Comments  Mildly positive on left with anterior hip and groin pain      Transfers   Transfers  Independent with all Transfers      Ambulation/Gait   Ambulation/Gait  Yes    Gait Pattern  Within Functional Limits                Objective measurements completed on examination: See above findings.      [facility] Adult PT Treatment/Exercise - 07/02/19 0001      Exercises   Exercises  Knee/Hip      Knee/Hip Exercises: Standing   Other Standing Knee Exercises  Hip airplane x20       Knee/Hip Exercises: Sidelying   Hip ABduction  15 reps    Hip ABduction Limitations  mod cueing to avoid rolling hips back    Other Sidelying Knee/Hip Exercises  Side plank with hip taps x30 sec    Other Sidelying Knee/Hip Exercises  Hip ER with 3# x20      Manual Therapy   Manual Therapy  Soft tissue mobilization    Soft tissue mobilization  Skilled  palpation for TPDN to left hip       Trigger Point Dry Needling - 07/02/19 0001    Consent Given?  Yes    Education Handout Provided  Previously provided    Muscles Treated Back/Hip  Piriformis   quadratus femoris   Piriformis Response  Twitch response elicited;Palpable increased muscle length           PT Education - 07/02/19 1537    Education Details  Exam findings, POC, HEP    Person(s) Educated  Patient    Methods  Explanation;Demonstration;Tactile cues;Verbal cues;Handout    Comprehension  Verbalized understanding;Returned demonstration;Verbal cues required;Tactile cues required;Need further instruction       PT Short Term Goals - 07/02/19 1720      PT SHORT TERM GOAL #1   Title  independent with initial HEP    Time  4    Period  Weeks    Status  New    Target Date  07/30/19      PT SHORT TERM GOAL #2   Title  Patient will report pain level with activity </= 4/10 to improve activity tolerance.    Time  4    Period  Weeks    Status  New    Target Date  07/30/19      PT SHORT TERM GOAL #3   Title  Patient will report improved sleep to where he does not need any medication for sleeping due to pain and spasm    Time  4    Period  Weeks    Status  New    Target Date  07/30/19        PT Long Term Goals - 07/02/19 1722      PT LONG TERM GOAL #1   Title  independent with advanced HEP    Time  8    Period  Weeks    Status  New    Target Date  08/27/19      PT LONG TERM GOAL #2   Title  Patient will exhibit improved hip abduction strength to >/= 4+/5 to allow for improved hip control with running    Time  8    Period  Weeks    Status  New    Target Date  08/27/19      PT LONG TERM GOAL #3   Title  Patient will report no limitation with running or stairs due to left hip pain    Time  8    Period  Weeks    Status  New    Target Date  08/27/19      PT LONG TERM GOAL #4   Title  Patient will report no sleep disturbances due to left hip pain or spasm     Time  8    Period  Weeks    Status  New    Target Date  08/27/19  Plan - 07/02/19 1705    Clinical Impression Statement  Patient presents to PT with report of chronic left hip pain with activities such as running. He exhibits weakness of left hip abductors and report of tightness of deep hip external rotators with tenderness just medial to posterior greater trochanter. He did have some mildly positive testing with [facility]. It seems his pain is muscular in nature and most likely is related to hip abduction weakness. He would benefit from continued skilled PT to progress his strength and control to reduce pain with activity.    Personal Factors and Comorbidities  Time since onset of injury/illness/exacerbation;Past/Current Experience    Examination-Activity Limitations  Sleep;Stairs   Running   Examination-Participation Restrictions  Community Activity    Stability/Clinical Decision Making  Stable/Uncomplicated    Clinical Decision Making  Low    Rehab Potential  Excellent    PT Frequency  1x / week    PT Duration  8 weeks    PT Treatment/Interventions  ADLs/Self Care Home Management;Cryotherapy;Electrical Stimulation;Moist Heat;Neuromuscular re-education;Balance training;Therapeutic exercise;Therapeutic activities;Stair training;Gait training;Patient/family education;Manual techniques;Dry needling;Passive range of motion;Taping;Spinal Manipulations;Joint Manipulations    PT Next Visit Plan  Assess HEP and progress PRM, assess response to dry needling, hip abduction strengthening    PT Home Exercise Plan  [email or web address]: side plank hip taps, slide lying hip abduction, side lying hip ER, standing hip airplanes    Consulted and Agree with Plan of Care  Patient       Patient will benefit from skilled therapeutic intervention in order to improve the following deficits and impairments:  Decreased strength, Pain, Decreased balance, Improper body mechanics, Decreased activity  tolerance  Visit Diagnosis: Pain in left hip  Muscle weakness (generalized)     Problem List Patient Active Problem List   Diagnosis Date Noted  . Right arm pain 09/08/2016  . Left Achilles tendinitis 09/21/2012    Rosana Hoes, PT, DPT, LAT, ATC 07/02/19  5:39 PM Phone: [phone] Fax: [phone]   [facility] [address] [place], [place], [address] Phone: [phone]   Fax:  [phone]  Name: Laiden Milles MRN: [id number] Date of Birth: Oct 23, 1975

## 2019-07-02 NOTE — Patient Instructions (Addendum)
Access Code: [email or web address]  URL: [email or web address]  Date: 07/02/2019  Prepared by: Rosana Hoes   Exercises Side Plank with Hip Drops - 3 sets - 30-60 sec hold - 1x daily Sidelying Hip Abduction at Wall - 20 reps - 3 sets - 1x daily Sidelying Hip External Rotation on Table - 20-30 reps - 3 sets - 1x daily  Hip Airplanes

## 2019-07-09 ENCOUNTER — Ambulatory Visit: Payer: 59 | Admitting: Physical Therapy

## 2019-07-13 ENCOUNTER — Encounter: Payer: Self-pay | Admitting: Physical Therapy

## 2019-07-13 ENCOUNTER — Ambulatory Visit: Payer: 59 | Attending: Surgery | Admitting: Physical Therapy

## 2019-07-13 ENCOUNTER — Other Ambulatory Visit: Payer: Self-pay

## 2019-07-13 DIAGNOSIS — M6281 Muscle weakness (generalized): Secondary | ICD-10-CM | POA: Diagnosis present

## 2019-07-13 DIAGNOSIS — M25552 Pain in left hip: Secondary | ICD-10-CM | POA: Diagnosis not present

## 2019-07-13 NOTE — Therapy (Signed)
[facility] [address] [place], [place], [address] Phone: [phone]   Fax:  [phone]  Physical Therapy Treatment  Patient Details  Name: Cory Alexander MRN: [id number] Date of Birth: 01-Jan-1976 Referring Provider (PT): Lenda Kelp, MD   Encounter Date: 07/13/2019  PT End of Session - 07/13/19 0905    Visit Number  2    Number of Visits  8    Date for PT Re-Evaluation  08/27/19    Authorization Type  Aetna    PT Start Time  [phone]    PT Stop Time  1000    PT Time Calculation (min)  55 min    Activity Tolerance  Patient tolerated treatment well    Behavior During Therapy  [facility] for tasks assessed/performed       Past Medical History:  Diagnosis Date  . Anxiety   . Neuromuscular disorder (HCC)    L arm, bicep tendon injury, R hamstring injured     Past Surgical History:  Procedure Laterality Date  . APPENDECTOMY     2005  . DISTAL BICEPS TENDON REPAIR  02/08/2012   Procedure: DISTAL BICEPS TENDON REPAIR;  Surgeon: Cammy Copa, MD;  Location: [facility];  Service: Orthopedics;  Laterality: Left;  Left distal biceps tendon repair  . FRACTURE SURGERY     06/2007- L wrist- repair  . REPAIR ANKLE LIGAMENT     cleaned out cartilage     There were no vitals filed for this visit.  Subjective Assessment - 07/13/19 0903    Subjective  "I was sore for a day or two after and felt alittle better but I did some running and the pain came back in the hip and I am having increased pain today"    Currently in Pain?  Yes    Pain Score  6     Pain Location  Hip    Pain Orientation  Left    Pain Descriptors / Indicators  Aching    Pain Type  Chronic pain    Pain Onset  More than a month ago    Pain Frequency  Intermittent    Aggravating Factors   running    Pain Relieving Factors  rest         [facility] PT Assessment - 07/13/19 0001      Assessment   Medical Diagnosis  Left hip pain    Referring Provider (PT)  Lenda Kelp, MD                   [facility] Adult PT Treatment/Exercise - 07/13/19 0001      Knee/Hip Exercises: Stretches   Active Hamstring Stretch  3 reps;Left;30 seconds   PNF contract/ relax      Knee/Hip Exercises: Aerobic   Nustep  L6 x 5 min LE only      Knee/Hip Exercises: Supine   Other Supine Knee/Hip Exercises  iliopsoas strengthening with L heel slideing up R shi   cues to focus strengthenging onf iliospsoas vs TFL     Manual Therapy   Manual Therapy  Soft tissue mobilization    Manual therapy comments  Skilled palpation for TPDN to left hip    Joint Mobilization  grade V LAD LLE     Soft tissue mobilization  IASTM along the piriformis / quadratus femoris    Muscle Energy Technique  L hip resisted flexion with LE in slight ER to promote  iliopsoas activation vs TFL. 1  x 10 holding 10 seconds       Trigger Point Dry Needling - 07/13/19 0001    Consent Given?  Yes    Education Handout Provided  Previously provided    Muscles Treated Back/Hip  Tensor fascia lata    Electrical Stimulation Performed with Dry Needling  Yes    E-stim with Dry Needling Details  frequency at 18 x 5 min increase to tolerance and muscle twitch    Piriformis Response  Twitch response elicited;Palpable increased muscle length   quadratus femoris    Tensor Fascia Lata Response  Twitch response elicited;Palpable increased muscle length           PT Education - 07/13/19 1142    Education Details  updated HEP for hip flexors and to stop exercise once he feels it working in the TFL and to focus on strengthening of the iliopsoas.    Person(s) Educated  Patient    Methods  Explanation;Verbal cues    Comprehension  Verbalized understanding;Verbal cues required       PT Short Term Goals - 07/02/19 1720      PT SHORT TERM GOAL #1   Title  independent with initial HEP    Time  4    Period  Weeks    Status  New    Target Date  07/30/19      PT SHORT TERM GOAL #2   Title  Patient  will report pain level with activity </= 4/10 to improve activity tolerance.    Time  4    Period  Weeks    Status  New    Target Date  07/30/19      PT SHORT TERM GOAL #3   Title  Patient will report improved sleep to where he does not need any medication for sleeping due to pain and spasm    Time  4    Period  Weeks    Status  New    Target Date  07/30/19        PT Long Term Goals - 07/02/19 1722      PT LONG TERM GOAL #1   Title  independent with advanced HEP    Time  8    Period  Weeks    Status  New    Target Date  08/27/19      PT LONG TERM GOAL #2   Title  Patient will exhibit improved hip abduction strength to >/= 4+/5 to allow for improved hip control with running    Time  8    Period  Weeks    Status  New    Target Date  08/27/19      PT LONG TERM GOAL #3   Title  Patient will report no limitation with running or stairs due to left hip pain    Time  8    Period  Weeks    Status  New    Target Date  08/27/19      PT LONG TERM GOAL #4   Title  Patient will report no sleep disturbances due to left hip pain or spasm    Time  8    Period  Weeks    Status  New    Target Date  08/27/19            Plan - 07/13/19 1144    Clinical Impression Statement  pt presents to his follow up with increased pain inthe hip located at the quadratus femoris  and TFL. continued TPDN for the TFL, quadratus femoris and piriformis utilizing E-stim for the quadratus femoris. pt demosntrates potenail SIJ anterior rotation deficit which could be a primary issue causing altered biomechanics occuring with running, which he is TFL dominate and exhibts weakness in the iliopsoas. updated HEp for iliopsoas strengtheing and hamstring stretching.    PT Treatment/Interventions  ADLs/Self Care Home Management;Cryotherapy;Electrical Stimulation;Moist Heat;Neuromuscular re-education;Balance training;Therapeutic exercise;Therapeutic activities;Stair training;Gait training;Patient/family  education;Manual techniques;Dry needling;Passive range of motion;Taping;Spinal Manipulations;Joint Manipulations    PT Next Visit Plan  Assess HEP and progress PRM, assess response to dry needling, hip abduction strengthening, how was DN,    PT Home Exercise Plan  [email or web address]: side plank hip taps, slide lying hip abduction, side lying hip ER, standing hip airplanes, hip flexion iliopsoas strengthening.    Consulted and Agree with Plan of Care  Patient       Patient will benefit from skilled therapeutic intervention in order to improve the following deficits and impairments:  Decreased strength, Pain, Decreased balance, Improper body mechanics, Decreased activity tolerance  Visit Diagnosis: Pain in left hip  Muscle weakness (generalized)     Problem List Patient Active Problem List   Diagnosis Date Noted  . Right arm pain 09/08/2016  . Left Achilles tendinitis 09/21/2012    Starr Lake PT, DPT, LAT, ATC  07/13/19  11:50 AM      [facility] [address] [place], [place], [address] Phone: [phone]   Fax:  [phone]  Name: Cory Alexander MRN: [id number] Date of Birth: March 26, 1976

## 2019-07-18 ENCOUNTER — Encounter: Payer: Self-pay | Admitting: Physical Therapy

## 2019-07-18 ENCOUNTER — Other Ambulatory Visit: Payer: Self-pay

## 2019-07-18 ENCOUNTER — Ambulatory Visit: Payer: 59 | Admitting: Physical Therapy

## 2019-07-18 DIAGNOSIS — M25552 Pain in left hip: Secondary | ICD-10-CM | POA: Diagnosis not present

## 2019-07-18 DIAGNOSIS — M6281 Muscle weakness (generalized): Secondary | ICD-10-CM

## 2019-07-18 NOTE — Therapy (Signed)
[place], [place], [address] Phone: [phone]   Fax:  [phone]  Physical Therapy Treatment  Patient Details  Name: Cory Alexander MRN: [id number] Date of Birth: [birth year] Referring Provider (PT): Dene Gentry, MD   Encounter Date: 07/18/2019  PT End of Session - 07/18/19 0848    Visit Number  3    Number of Visits  8    Date for PT Re-Evaluation  08/27/19    Authorization Type  Aetna    PT Start Time  0848    PT Stop Time  0929    PT Time Calculation (min)  41 min    Activity Tolerance  Patient tolerated treatment well    Behavior During Therapy  [facility] for tasks assessed/performed       Past Medical History:  Diagnosis Date  . Anxiety   . Neuromuscular disorder (HCC)    L arm, bicep tendon injury, R hamstring injured     Past Surgical History:  Procedure Laterality Date  . APPENDECTOMY     2005  . DISTAL BICEPS TENDON REPAIR  02/08/2012   Procedure: DISTAL BICEPS TENDON REPAIR;  Surgeon: Meredith Pel, MD;  Location: [facility];  Service: Orthopedics;  Laterality: Left;  Left distal biceps tendon repair  . FRACTURE SURGERY     06/2007- L wrist- repair  . REPAIR ANKLE LIGAMENT     cleaned out cartilage     There were no vitals filed for this visit.  Subjective Assessment - 07/18/19 0849    Subjective  "I though the last session helped, I did tried running this morning and it helped reduce the pain"    Currently in Pain?  Yes    Pain Score  4     Pain Location  Hip    Pain Orientation  Left                       [facility] Adult PT Treatment/Exercise - 07/18/19 0857      Knee/Hip Exercises: Stretches   Active Hamstring Stretch  3 reps;30 seconds   PNF contract / relax   ITB Stretch  2 reps;30 seconds;Left      Knee/Hip Exercises: Supine   Straight Leg Raises  Strengthening;Left;2 sets;10 reps   with hip adduction and slight ER    Straight Leg Raises Limitations   focus on avoiding TFL activation      Knee/Hip Exercises: Sidelying   Hip ABduction  Strengthening;Left;2 sets   going to fatigue    Hip ABduction Limitations  cues to roll the hip slightly forward to maximize    avoiding activation of TFL   Other Sidelying Knee/Hip Exercises  hip ER with 4# 2 x 20      Manual Therapy   Manual Therapy  Other (comment)    Manual therapy comments  Skilled palpation for TPDN to left hip    Joint Mobilization  grade V LAD LLE     Soft tissue mobilization  IASTM along the TFL and piriformis    Other Manual Therapy  tack and stretch of the L piriformis       Trigger Point Dry Needling - 07/18/19 0001    Consent Given?  Yes    Education Handout Provided  Previously provided    Muscles Treated Back/Hip  Tensor fascia lata    Piriformis Response  Twitch response elicited;Palpable increased muscle length   L   Tensor Fascia Lata Response  Twitch response elicited;Palpable increased muscle length   L            PT Short Term Goals - 07/02/19 1720      PT SHORT TERM GOAL #1   Title  independent with initial HEP    Time  4    Period  Weeks    Status  New    Target Date  07/30/19      PT SHORT TERM GOAL #2   Title  Patient will report pain level with activity </= 4/10 to improve activity tolerance.    Time  4    Period  Weeks    Status  New    Target Date  07/30/19      PT SHORT TERM GOAL #3   Title  Patient will report improved sleep to where he does not need any medication for sleeping due to pain and spasm    Time  4    Period  Weeks    Status  New    Target Date  07/30/19        PT Long Term Goals - 07/02/19 1722      PT LONG TERM GOAL #1   Title  independent with advanced HEP    Time  8    Period  Weeks    Status  New    Target Date  08/27/19      PT LONG TERM GOAL #2   Title  Patient will exhibit improved hip abduction strength to >/= 4+/5 to allow for improved hip control with running    Time  8    Period  Weeks     Status  New    Target Date  08/27/19      PT LONG TERM GOAL #3   Title  Patient will report no limitation with running or stairs due to left hip pain    Time  8    Period  Weeks    Status  New    Target Date  08/27/19      PT LONG TERM GOAL #4   Title  Patient will report no sleep disturbances due to left hip pain or spasm    Time  8    Period  Weeks    Status  New    Target Date  08/27/19            Plan - 07/18/19 0950    Clinical Impression Statement  pt reports decreased pain today compared to the previous session. continued TPDN focusing on TFL and piriformis followed with IASTM techniques. continued hip strengthening of the hip flexors/ abductors and external rotations with focus on avoiding TFL activation due to pt being TFL dominate.    PT Next Visit Plan  Assess HEP and progress PRM, assess response to dry needling, hip abduction strengthening, how was DN, review running mechanics, continue rectus femoris and iliopsoas strengtheing    PT Home Exercise Plan  [email or web address]: side plank hip taps, slide lying hip abduction, side lying hip ER, standing hip airplanes, hip flexion iliopsoas strengthening.    Consulted and Agree with Plan of Care  Patient       Patient will benefit from skilled therapeutic intervention in order to improve the following deficits and impairments:     Visit Diagnosis: Pain in left hip  Muscle weakness (generalized)     Problem List Patient Active Problem List   Diagnosis Date Noted  . Right arm pain 09/08/2016  . Left  Achilles tendinitis 09/21/2012    Lulu Riding PT, DPT, LAT, ATC  07/18/19  9:53 AM      [facility] [address] [place], [place], [address] Phone: [phone]   Fax:  [phone]  Name: Ganesh Deeg MRN: [id number] Date of Birth: 03-12-1976

## 2019-07-24 ENCOUNTER — Ambulatory Visit: Payer: 59 | Admitting: Physical Therapy

## 2019-07-24 ENCOUNTER — Encounter: Payer: Self-pay | Admitting: Physical Therapy

## 2019-07-24 ENCOUNTER — Other Ambulatory Visit: Payer: Self-pay

## 2019-07-24 DIAGNOSIS — M25552 Pain in left hip: Secondary | ICD-10-CM

## 2019-07-24 DIAGNOSIS — M6281 Muscle weakness (generalized): Secondary | ICD-10-CM

## 2019-07-24 NOTE — Therapy (Signed)
[facility] [address] [place], [place], [address] Phone: [phone]   Fax:  [phone]  Physical Therapy Treatment  Patient Details  Name: Cory Alexander MRN: [id number] Date of Birth: 11-25-1975 Referring Provider (PT): Lenda Kelp, MD   Encounter Date: 07/24/2019  PT End of Session - 07/24/19 0920    Visit Number  4    Number of Visits  8    Date for PT Re-Evaluation  08/27/19    Authorization Type  Aetna    PT Start Time  0825    PT Stop Time  0910    PT Time Calculation (min)  45 min    Activity Tolerance  Patient tolerated treatment well    Behavior During Therapy  [facility] for tasks assessed/performed       Past Medical History:  Diagnosis Date  . Anxiety   . Neuromuscular disorder (HCC)    L arm, bicep tendon injury, R hamstring injured     Past Surgical History:  Procedure Laterality Date  . APPENDECTOMY     2005  . DISTAL BICEPS TENDON REPAIR  02/08/2012   Procedure: DISTAL BICEPS TENDON REPAIR;  Surgeon: Cammy Copa, MD;  Location: [facility] OR;  Service: Orthopedics;  Laterality: Left;  Left distal biceps tendon repair  . FRACTURE SURGERY     06/2007- L wrist- repair  . REPAIR ANKLE LIGAMENT     cleaned out cartilage     There were no vitals filed for this visit.  Subjective Assessment - 07/24/19 0826    Subjective  Patient reports he is doing well. He has been doing some light running, and it always feels fine while he is doing it but then afterward he will start having a throbbing pain. Typically the throbbing occurs about 2 hours after he runs. The throbbing continues to occur deep in the posterior lateral aspect of the hip.    Patient Stated Goals  Get rid of hip pain and return to running without limitation    Currently in Pain?  Yes    Pain Score  5     Pain Location  Hip    Pain Orientation  Left    Pain Descriptors / Indicators  Aching    Pain Type  Chronic pain    Pain Onset  More than a  month ago    Pain Frequency  Intermittent         [facility] PT Assessment - 07/24/19 0001      Strength   Right Hip Extension  4+/5    Right Hip External Rotation   4+/5    Right Hip ABduction  4+/5    Left Hip Extension  4/5   attempts to compensate with lumbar   Left Hip External Rotation  4/5    Left Hip ABduction  4/5   attempts to compensate with TFL                  [facility] Adult PT Treatment/Exercise - 07/24/19 0001      Self-Care   Self-Care  Other Self-Care Comments    Other Self-Care Comments   Running on treadmill with video review and education on running form contributing to symptoms      Exercises   Exercises  Knee/Hip      Knee/Hip Exercises: Aerobic   Elliptical  L5 x 4 min with ramp 5    Tread Mill  6.0 mph x 5 min as part of self-care  Knee/Hip Exercises: Standing   Functional Squat  10 reps    Functional Squat Limitations  blue band around knees to focus on motor control    Other Standing Knee Exercises  Hip airplane with BUE support 2x10 - focus on slow and controlled movement      Knee/Hip Exercises: Sidelying   Hip ABduction  2 sets;15 reps    Hip ABduction Limitations  heel against wall to encourage more glute max activation and avoid TFL compensation    Clams  2x20 with blue band - focus on feeling exercise in glute region      Knee/Hip Exercises: Prone   Hip Extension  2 sets;15 reps   2 pillows under hips/abdomen   Hip Extension Limitations  patient reports greater difficulty activating left glute             PT Education - 07/24/19 0832    Education Details  HEP    Person(s) Educated  Patient    Methods  Explanation;Demonstration;Verbal cues    Comprehension  Verbalized understanding;Returned demonstration;Verbal cues required;Need further instruction       PT Short Term Goals - 07/24/19 0929      PT SHORT TERM GOAL #1   Title  independent with initial HEP    Time  4    Period  Weeks    Status  On-going     Target Date  07/30/19      PT SHORT TERM GOAL #2   Title  Patient will report pain level with activity </= 4/10 to improve activity tolerance.    Time  4    Period  Weeks    Status  On-going    Target Date  07/30/19      PT SHORT TERM GOAL #3   Title  Patient will report improved sleep to where he does not need any medication for sleeping due to pain and spasm    Time  4    Period  Weeks    Status  On-going    Target Date  07/30/19        PT Long Term Goals - 07/02/19 1722      PT LONG TERM GOAL #1   Title  independent with advanced HEP    Time  8    Period  Weeks    Status  New    Target Date  08/27/19      PT LONG TERM GOAL #2   Title  Patient will exhibit improved hip abduction strength to >/= 4+/5 to allow for improved hip control with running    Time  8    Period  Weeks    Status  New    Target Date  08/27/19      PT LONG TERM GOAL #3   Title  Patient will report no limitation with running or stairs due to left hip pain    Time  8    Period  Weeks    Status  New    Target Date  08/27/19      PT LONG TERM GOAL #4   Title  Patient will report no sleep disturbances due to left hip pain or spasm    Time  8    Period  Weeks    Status  New    Target Date  08/27/19            Plan - 07/24/19 5176    Clinical Impression Statement  Patient tolerated therapy well. He exhibits  poor glute control with running, with greater trendelenburg, and continues to exhibit glute weakness on the left compared to the right. He also exhibits left glute observably is smaller than right. He was educated on glute activation and strengthening and how this could be contributing to his symptoms. His HEP was modified to include more glute activation exercises. He would benefit from continued skilled PT to progress his strength and control to allow for reduced pain with activity.    PT Treatment/Interventions  ADLs/Self Care Home Management;Cryotherapy;Electrical Stimulation;Moist  Heat;Neuromuscular re-education;Balance training;Therapeutic exercise;Therapeutic activities;Stair training;Gait training;Patient/family education;Manual techniques;Dry needling;Passive range of motion;Taping;Spinal Manipulations;Joint Manipulations    PT Next Visit Plan  Assess HEP and progress PRM, assess response to dry needling, hip abduction/extension activation and strengthening, continue rectus femoris and iliopsoas strengtheing    PT Home Exercise Plan  [id number]: slide lying hip abduction with heel on wall, side clamshell with blue, prone hip extension with pillows under hips, side lying hip ER, standing hip airplanes, hip flexion iliopsoas strengthening.    Consulted and Agree with Plan of Care  Patient       Patient will benefit from skilled therapeutic intervention in order to improve the following deficits and impairments:  Decreased strength, Pain, Decreased balance, Improper body mechanics, Decreased activity tolerance  Visit Diagnosis: Pain in left hip  Muscle weakness (generalized)     Problem List Patient Active Problem List   Diagnosis Date Noted  . Right arm pain 09/08/2016  . Left Achilles tendinitis 09/21/2012    Rosana Hoes, PT, DPT 07/24/19  9:40 AM Phone: [phone] Fax: [phone]   [facility] [address] [place], [place], [address] Phone: [phone]   Fax:  [phone]  Name: Tyrann Donaho MRN: [id number] Date of Birth: June 07, 1976

## 2019-07-28 ENCOUNTER — Ambulatory Visit: Payer: 59 | Attending: Internal Medicine

## 2019-07-28 DIAGNOSIS — Z23 Encounter for immunization: Secondary | ICD-10-CM

## 2019-07-28 NOTE — Progress Notes (Signed)
   Covid-19 Vaccination Clinic  Name:  Cory Alexander    MRN: [id number] DOB: 06-16-1975  07/28/2019  Cory Alexander was observed post Covid-19 immunization for 15 minutes without incidence. He was provided with Vaccine Information Sheet and instruction to access the V-Safe system.   Cory Alexander was instructed to call 911 with any severe reactions post vaccine: Marland Kitchen Difficulty breathing  . Swelling of your face and throat  . A fast heartbeat  . A bad rash all over your body  . Dizziness and weakness    Immunizations Administered    Name Date Dose VIS Date Route   Pfizer COVID-19 Vaccine 07/28/2019 12:32 PM 0.3 mL 05/18/2019 Intramuscular   Manufacturer: [employer], [employer]   Lot: [id number]   NDC: [address]-8

## 2019-07-31 ENCOUNTER — Other Ambulatory Visit: Payer: Self-pay

## 2019-07-31 ENCOUNTER — Encounter: Payer: Self-pay | Admitting: Physical Therapy

## 2019-07-31 ENCOUNTER — Ambulatory Visit: Payer: 59 | Admitting: Physical Therapy

## 2019-07-31 DIAGNOSIS — M25552 Pain in left hip: Secondary | ICD-10-CM | POA: Diagnosis not present

## 2019-07-31 DIAGNOSIS — M6281 Muscle weakness (generalized): Secondary | ICD-10-CM

## 2019-07-31 NOTE — Therapy (Signed)
[facility] [address] [place], [place], [address] Phone: [phone]   Fax:  [phone]  Physical Therapy Treatment  Patient Details  Name: Cory Alexander MRN: [id number] Date of Birth: 03/26/1976 Referring Provider (PT): Lenda Kelp, MD   Encounter Date: 07/31/2019  PT End of Session - 07/31/19 0844    Visit Number  5    Number of Visits  8    Date for PT Re-Evaluation  08/27/19    Authorization Type  Aetna    PT Start Time  0831    PT Stop Time  0911    PT Time Calculation (min)  40 min    Activity Tolerance  Patient tolerated treatment well    Behavior During Therapy  [facility] for tasks assessed/performed       Past Medical History:  Diagnosis Date  . Anxiety   . Neuromuscular disorder (HCC)    L arm, bicep tendon injury, R hamstring injured     Past Surgical History:  Procedure Laterality Date  . APPENDECTOMY     2005  . DISTAL BICEPS TENDON REPAIR  02/08/2012   Procedure: DISTAL BICEPS TENDON REPAIR;  Surgeon: Cammy Copa, MD;  Location: [facility] OR;  Service: Orthopedics;  Laterality: Left;  Left distal biceps tendon repair  . FRACTURE SURGERY     06/2007- L wrist- repair  . REPAIR ANKLE LIGAMENT     cleaned out cartilage     There were no vitals filed for this visit.  Subjective Assessment - 07/31/19 0833    Subjective  Patient reports no real change in symptoms. He did run this past weekend and states that he is fine while running but continues to have pain a few hours after. He did take some medication over the weekend to help with his sleeping.    Patient Stated Goals  Get rid of hip pain and return to running without limitation    Currently in Pain?  Yes    Pain Score  3     Pain Location  Hip    Pain Orientation  Left    Pain Descriptors / Indicators  Aching    Pain Type  Chronic pain    Pain Onset  More than a month ago    Pain Frequency  Intermittent                        [facility] Adult PT Treatment/Exercise - 07/31/19 0001      Exercises   Exercises  Knee/Hip      Knee/Hip Exercises: Standing   Forward Step Up  2 sets;10 reps    Forward Step Up Limitations  14" box, runner step-up    Other Standing Knee Exercises  Single leg RDL with 15# 2x10      Knee/Hip Exercises: Supine   Single Leg Bridge  2 sets;10 reps    Other Supine Knee/Hip Exercises  Hip thrust with 45# 3x8      Knee/Hip Exercises: Sidelying   Hip ABduction  3 sets;15 reps    Hip ABduction Limitations  heel against wall to encourage more glute max activation and avoid TFL compensation      Knee/Hip Exercises: Prone   Hip Extension  3 sets;10 reps    Hip Extension Limitations  2#             PT Education - 07/31/19 0843    Education Details  HEP    Person(s) Educated  Patient    Methods  Explanation;Demonstration;Verbal cues    Comprehension  Verbalized understanding;Returned demonstration;Verbal cues required;Need further instruction       PT Short Term Goals - 07/24/19 0929      PT SHORT TERM GOAL #1   Title  independent with initial HEP    Time  4    Period  Weeks    Status  On-going    Target Date  07/30/19      PT SHORT TERM GOAL #2   Title  Patient will report pain level with activity </= 4/10 to improve activity tolerance.    Time  4    Period  Weeks    Status  On-going    Target Date  07/30/19      PT SHORT TERM GOAL #3   Title  Patient will report improved sleep to where he does not need any medication for sleeping due to pain and spasm    Time  4    Period  Weeks    Status  On-going    Target Date  07/30/19        PT Long Term Goals - 07/02/19 1722      PT LONG TERM GOAL #1   Title  independent with advanced HEP    Time  8    Period  Weeks    Status  New    Target Date  08/27/19      PT LONG TERM GOAL #2   Title  Patient will exhibit improved hip abduction strength to >/= 4+/5 to allow for improved hip  control with running    Time  8    Period  Weeks    Status  New    Target Date  08/27/19      PT LONG TERM GOAL #3   Title  Patient will report no limitation with running or stairs due to left hip pain    Time  8    Period  Weeks    Status  New    Target Date  08/27/19      PT LONG TERM GOAL #4   Title  Patient will report no sleep disturbances due to left hip pain or spasm    Time  8    Period  Weeks    Status  New    Target Date  08/27/19            Plan - 07/31/19 0845    Clinical Impression Statement  Therapy focused on strengthening and improving engagement of glutes. He continues to exhibit difficulty using the left glute and reports increased fatigue with exercises. He would benefit from continued skilled PT to progress glute strength and control to reduce pain of left hip following running and other activity.    PT Treatment/Interventions  ADLs/Self Care Home Management;Cryotherapy;Electrical Stimulation;Moist Heat;Neuromuscular re-education;Balance training;Therapeutic exercise;Therapeutic activities;Stair training;Gait training;Patient/family education;Manual techniques;Dry needling;Passive range of motion;Taping;Spinal Manipulations;Joint Manipulations    PT Next Visit Plan  Assess HEP and progress PRM, assess response to dry needling, hip abduction/extension activation and strengthening, continue rectus femoris and iliopsoas strengtheing    PT Home Exercise Plan  [id number]: slide lying hip abduction with heel on wall, side clamshell with blue, prone hip extension with pillows under hips, side lying hip ER, standing hip airplanes, hip flexion iliopsoas strengthening.    Consulted and Agree with Plan of Care  Patient       Patient will benefit from skilled therapeutic intervention in order to improve  the following deficits and impairments:  Decreased strength, Pain, Decreased balance, Improper body mechanics, Decreased activity tolerance  Visit Diagnosis: Pain in left  hip  Muscle weakness (generalized)     Problem List Patient Active Problem List   Diagnosis Date Noted  . Right arm pain 09/08/2016  . Left Achilles tendinitis 09/21/2012    Hilda Blades, PT, DPT, LAT, ATC 07/31/19  9:43 AM Phone: [phone] Fax: [facility] [address] [place], [place], [address] Phone: [phone]   Fax:  [phone]  Name: Cory Alexander MRN: [id number] Date of Birth: 17-Apr-1976

## 2019-08-07 ENCOUNTER — Other Ambulatory Visit: Payer: Self-pay

## 2019-08-07 ENCOUNTER — Encounter: Payer: Self-pay | Admitting: Physical Therapy

## 2019-08-07 ENCOUNTER — Ambulatory Visit: Payer: 59 | Attending: Surgery | Admitting: Physical Therapy

## 2019-08-07 DIAGNOSIS — M25552 Pain in left hip: Secondary | ICD-10-CM

## 2019-08-07 DIAGNOSIS — M6281 Muscle weakness (generalized): Secondary | ICD-10-CM | POA: Diagnosis present

## 2019-08-07 DIAGNOSIS — R252 Cramp and spasm: Secondary | ICD-10-CM | POA: Diagnosis present

## 2019-08-07 DIAGNOSIS — R1084 Generalized abdominal pain: Secondary | ICD-10-CM | POA: Insufficient documentation

## 2019-08-07 NOTE — Therapy (Signed)
[facility] Outpatient Rehabilitation [facility] [address] [place], [place], [address] Phone: [phone]   Fax:  [phone]  Physical Therapy Treatment  Patient Details  Name: Cory Alexander MRN: [id number] Date of Birth: Nov 26, 1975 Referring Provider (PT): Lenda Kelp, MD   Encounter Date: 08/07/2019  PT End of Session - 08/07/19 0754    Visit Number  6    Number of Visits  8    Date for PT Re-Evaluation  08/27/19    Authorization Type  Aetna    PT Start Time  0757    PT Stop Time  0844    PT Time Calculation (min)  47 min    Activity Tolerance  Patient tolerated treatment well    Behavior During Therapy  [facility] for tasks assessed/performed       Past Medical History:  Diagnosis Date  . Anxiety   . Neuromuscular disorder (HCC)    L arm, bicep tendon injury, R hamstring injured     Past Surgical History:  Procedure Laterality Date  . APPENDECTOMY     2005  . DISTAL BICEPS TENDON REPAIR  02/08/2012   Procedure: DISTAL BICEPS TENDON REPAIR;  Surgeon: Cammy Copa, MD;  Location: [facility] OR;  Service: Orthopedics;  Laterality: Left;  Left distal biceps tendon repair  . FRACTURE SURGERY     06/2007- L wrist- repair  . REPAIR ANKLE LIGAMENT     cleaned out cartilage     There were no vitals filed for this visit.  Subjective Assessment - 08/07/19 0759    Subjective  "I am doing okay today. On sunday we did a 3 mile walk and I noticed increased pain in the hip which bothered me throughout the day and at night"    Patient Stated Goals  Get rid of hip pain and return to running without limitation    Currently in Pain?  Yes    Pain Score  5     Pain Location  Hip    Pain Orientation  Left    Pain Type  Chronic pain    Pain Onset  More than a month ago                       [facility] Adult PT Treatment/Exercise - 08/07/19 0001      Self-Care   Other Self-Care Comments   cues to use tennis ball to help reduce piriformis tightness       Knee/Hip Exercises: Stretches   Piriformis Stretch  2 reps;30 seconds      Knee/Hip Exercises: Machines for Strengthening   Hip Cybex  hip flexion 2 x 25# going to fatigue (cues to montior for  iliopsoas vs TFL activation), and 2 x foing to fatigue hip extension 32.5#      Knee/Hip Exercises: Supine   Single Leg Bridge  Strengthening;2 sets;10 reps   with RLE straight, and arms crossed chest for core activatio     Manual Therapy   Manual therapy comments  Skilled palpation for TPDN to left hip    Joint Mobilization  --    Soft tissue mobilization  IASTM along the TFL and piriformis    Other Manual Therapy  tack and stretch of the L piriformis       Trigger Point Dry Needling - 08/07/19 0001    Consent Given?  Yes    Education Handout Provided  Previously provided    Piriformis Response  Twitch response elicited;Palpable increased muscle length  PT Short Term Goals - 07/24/19 0929      PT SHORT TERM GOAL #1   Title  independent with initial HEP    Time  4    Period  Weeks    Status  On-going    Target Date  07/30/19      PT SHORT TERM GOAL #2   Title  Patient will report pain level with activity </= 4/10 to improve activity tolerance.    Time  4    Period  Weeks    Status  On-going    Target Date  07/30/19      PT SHORT TERM GOAL #3   Title  Patient will report improved sleep to where he does not need any medication for sleeping due to pain and spasm    Time  4    Period  Weeks    Status  On-going    Target Date  07/30/19        PT Long Term Goals - 07/02/19 1722      PT LONG TERM GOAL #1   Title  independent with advanced HEP    Time  8    Period  Weeks    Status  New    Target Date  08/27/19      PT LONG TERM GOAL #2   Title  Patient will exhibit improved hip abduction strength to >/= 4+/5 to allow for improved hip control with running    Time  8    Period  Weeks    Status  New    Target Date  08/27/19      PT LONG TERM GOAL #3    Title  Patient will report no limitation with running or stairs due to left hip pain    Time  8    Period  Weeks    Status  New    Target Date  08/27/19      PT LONG TERM GOAL #4   Title  Patient will report no sleep disturbances due to left hip pain or spasm    Time  8    Period  Weeks    Status  New    Target Date  08/27/19            Plan - 08/07/19 0846    Clinical Impression Statement  pt reports increased soreness in the hip following walking for 3 miles over the weekend. continued TPDN focusing on the piriformis followed with IASTM. continued hip strengthening focusing on posterior chain focusing on endurnace training but he does fatigue quickly with especially with single leg bridges. plan to see pt back in 2 weeks to assess how he is doing wihtout PT.    PT Treatment/Interventions  ADLs/Self Care Home Management;Cryotherapy;Electrical Stimulation;Moist Heat;Neuromuscular re-education;Balance training;Therapeutic exercise;Therapeutic activities;Stair training;Gait training;Patient/family education;Manual techniques;Dry needling;Passive range of motion;Taping;Spinal Manipulations;Joint Manipulations    PT Next Visit Plan  how was MD visit, assess response to dry needling, hip abduction/extension activation and strengthening, continue rectus femoris and iliopsoas strengtheing    PT Home Exercise Plan  [id number]: slide lying hip abduction with heel on wall, side clamshell with blue, prone hip extension with pillows under hips, side lying hip ER, standing hip airplanes, hip flexion iliopsoas strengthening.    Consulted and Agree with Plan of Care  Patient       Patient will benefit from skilled therapeutic intervention in order to improve the following deficits and impairments:  Decreased strength, Pain, Decreased balance, Improper body  mechanics, Decreased activity tolerance  Visit Diagnosis: Pain in left hip  Muscle weakness (generalized)  Generalized abdominal pain  Cramp  and spasm     Problem List Patient Active Problem List   Diagnosis Date Noted  . Right arm pain 09/08/2016  . Left Achilles tendinitis 09/21/2012   Lulu Riding PT, DPT, LAT, ATC  08/07/19  8:59 AM      [facility] [address] [place], [place], [address] Phone: [phone]   Fax:  [phone]  Name: Cory Alexander MRN: [id number] Date of Birth: 1975-11-18

## 2019-08-08 ENCOUNTER — Ambulatory Visit: Payer: 59 | Admitting: Family Medicine

## 2019-08-08 ENCOUNTER — Ambulatory Visit: Payer: Self-pay

## 2019-08-08 ENCOUNTER — Encounter: Payer: Self-pay | Admitting: Family Medicine

## 2019-08-08 VITALS — BP 122/78 | Ht 72.0 in | Wt 205.0 lb

## 2019-08-08 DIAGNOSIS — M25552 Pain in left hip: Secondary | ICD-10-CM

## 2019-08-08 MED ORDER — GABAPENTIN 300 MG PO CAPS: 300.0000 mg | ORAL_CAPSULE | Freq: Every day | ORAL | 2 refills | Status: DC

## 2019-08-08 NOTE — Progress Notes (Signed)
[facility] [address], [place] [address] Phone: [phone] Fax: [phone]   Patient Name: Cory Alexander Date of Birth: 1976/01/17 Medical Record Number: [id number] Gender: male Date of Encounter: 08/08/2019  CC: Follow-up left hip pain  HPI: Cory Alexander is here to follow-up on left hip pain.  He has been going to physical therapy and noticed improvement in his anterior hip, but little to no improvement in the posterior hip.  He is able to run, but after the run will have some pain.  He has moderate pain with just walking.  He is requiring gabapentin nightly to get good sleep.  He has tried nitro patches in the past for an Achilles injury, but did have moderate headaches.  He has not tried the diclofenac.  Denies any radiating symptoms into his foot.  Pain is still sharp and localized to the posterior lateral hip.  Past Medical History:  Diagnosis Date  . Anxiety   . Neuromuscular disorder (HCC)    L arm, bicep tendon injury, R hamstring injured     Current Outpatient Medications on File Prior to Visit  Medication Sig Dispense Refill  . PARoxetine (PAXIL) 10 MG tablet     . CIALIS 20 MG tablet     . diclofenac (VOLTAREN) 75 MG EC tablet Take 1 tablet (75 mg total) by mouth 2 (two) times daily. 60 tablet 1   No current facility-administered medications on file prior to visit.    Past Surgical History:  Procedure Laterality Date  . APPENDECTOMY     2005  . DISTAL BICEPS TENDON REPAIR  02/08/2012   Procedure: DISTAL BICEPS TENDON REPAIR;  Surgeon: Meredith Pel, MD;  Location: [facility];  Service: Orthopedics;  Laterality: Left;  Left distal biceps tendon repair  . FRACTURE SURGERY     06/2007- L wrist- repair  . REPAIR ANKLE LIGAMENT     cleaned out cartilage     No Known Allergies  Social History   Socioeconomic History  . Marital status: Married    Spouse name: Not on file  . Number of children: Not on file  . Years of  education: Not on file  . Highest education level: Not on file  Occupational History  . Not on file  Tobacco Use  . Smoking status: Never Smoker  . Smokeless tobacco: Never Used  Substance and Sexual Activity  . Alcohol use: Yes    Comment: social   . Drug use: No  . Sexual activity: Not on file  Other Topics Concern  . Not on file  Social History Narrative  . Not on file   Social Determinants of Health   Financial Resource Strain:   . Difficulty of Paying Living Expenses: Not on file  Food Insecurity:   . Worried About Charity fundraiser in the Last Year: Not on file  . Ran Out of Food in the Last Year: Not on file  Transportation Needs:   . Lack of Transportation (Medical): Not on file  . Lack of Transportation (Non-Medical): Not on file  Physical Activity:   . Days of Exercise per Week: Not on file  . Minutes of Exercise per Session: Not on file  Stress:   . Feeling of Stress : Not on file  Social Connections:   . Frequency of Communication with Friends and Family: Not on file  . Frequency of Social Gatherings with Friends and Family: Not on file  . Attends Religious Services: Not on  file  . Active Member of Clubs or Organizations: Not on file  . Attends Banker Meetings: Not on file  . Marital Status: Not on file  Intimate Partner Violence:   . Fear of Current or Ex-Partner: Not on file  . Emotionally Abused: Not on file  . Physically Abused: Not on file  . Sexually Abused: Not on file    Family History  Problem Relation Age of Onset  . Heart attack Father   . Diabetes Neg Hx   . Hyperlipidemia Neg Hx   . Hypertension Neg Hx   . Sudden death Neg Hx     BP 122/78   Ht 6' (1.829 m)   Wt 205 lb (93 kg)   BMI 27.80 kg/m   ROS:  See HPI CONST: no F/C, no malaise, no fatigue MSK: See above NEURO: no numbness/tingling SKIN: no rash, no lesions HEME: no bleeding, no bruising, no erythema  Objective: GEN: Alert and oriented, NAD Pulm:  Breathing unlabored PSY: normal mood, congruent affect  Left hip: ROM IR: 40 Deg, ER: 45 Deg, Flexion: 120 Deg, Extension: 100 Deg, Abduction: 45 Deg, Adduction: 45 Deg Strength IR: 5/5, ER: 5/5, Flexion: 5/5, Extension: 5/5, Abduction: 5/5, Adduction: 5/5 Pelvic alignment unremarkable to inspection and palpation. Negative logroll Standing hip rotation and gait without trendelenburg sign / unsteadiness.  Endorses fatigue during Trendelenburg testing Greater trochanter without tenderness to palpation. Mild tenderness over piriformis. No pain with FABER or FADIR. No SI joint tenderness and normal minimal SI movement.  Limited MSK ultrasound Left gluteus The deep external rotators of the left hip were visualized in long and short axis with mild hypoechoic changes seen just of the origin of the sacrum more inferiorly.  Impression: Deep hip external rotator tendinosis with questionable minimal bursitis overlying piriformis.  Assessment and Plan:  1.  Left hip pain  Etiology appears to be consistent with a tendinosis of the deep external rotators.  Steward will attempt nitro patches, starting with a quarter tab at the maximal point of tenderness.  Recommended continuing with physical therapy.  Explained that an MRI at this point may not show [place] the pathology we are looking for.  We have refilled his gabapentin to use nightly.  He can also consider starting the diclofenac if pain or inflammation does not subside in time.   Judge Stall, DO, ATC Sports Medicine Fellow

## 2019-08-08 NOTE — Patient Instructions (Signed)
Your pain is consistent with hip external rotator strain with secondary hip flexor strain, less likely labrum tear of your hip. Continue physical therapy and home exercises. Nitro 1/4th patch to affected area, change daily - be patient with these. Consider the diclofenac 75mg  twice a day with food. Take gabapentin at bedtime regularly - this can be titrated up to three times a day if needed. Ok to take tylenol with this. Activities as tolerated but avoid hills and stairs as much as possible. Consider MR arthrogram though your history and exam don't show any evidence at all of something going on in the hip joint. Follow up with me in 6 weeks for reevaluation.

## 2019-08-21 ENCOUNTER — Encounter: Payer: Self-pay | Admitting: Physical Therapy

## 2019-08-21 ENCOUNTER — Ambulatory Visit: Payer: 59 | Attending: Internal Medicine

## 2019-08-21 ENCOUNTER — Ambulatory Visit: Payer: 59 | Admitting: Physical Therapy

## 2019-08-21 ENCOUNTER — Other Ambulatory Visit: Payer: Self-pay

## 2019-08-21 DIAGNOSIS — R252 Cramp and spasm: Secondary | ICD-10-CM

## 2019-08-21 DIAGNOSIS — M6281 Muscle weakness (generalized): Secondary | ICD-10-CM

## 2019-08-21 DIAGNOSIS — R1084 Generalized abdominal pain: Secondary | ICD-10-CM

## 2019-08-21 DIAGNOSIS — Z23 Encounter for immunization: Secondary | ICD-10-CM

## 2019-08-21 DIAGNOSIS — M25552 Pain in left hip: Secondary | ICD-10-CM | POA: Diagnosis not present

## 2019-08-21 NOTE — Therapy (Addendum)
[facility], [place], [address] Phone: [phone]   Fax:  [phone]  Physical Therapy Treatment / Discharge  Patient Details  Name: Marlo Goodrich MRN: [id number] Date of Birth: 11-11-1975 Referring Provider (PT): Dene Gentry, MD   Encounter Date: 08/21/2019  PT End of Session - 08/21/19 0759    Visit Number  7    Number of Visits  8    Date for PT Re-Evaluation  08/27/19    Authorization Type  [employer]    PT Start Time  0800    PT Stop Time  0842    PT Time Calculation (min)  42 min    Activity Tolerance  Patient tolerated treatment well    Behavior During Therapy  [facility] for tasks assessed/performed       Past Medical History:  Diagnosis Date  . Anxiety   . Neuromuscular disorder (HCC)    L arm, bicep tendon injury, R hamstring injured     Past Surgical History:  Procedure Laterality Date  . APPENDECTOMY     2005  . DISTAL BICEPS TENDON REPAIR  02/08/2012   Procedure: DISTAL BICEPS TENDON REPAIR;  Surgeon: Meredith Pel, MD;  Location: [facility];  Service: Orthopedics;  Laterality: Left;  Left distal biceps tendon repair  . FRACTURE SURGERY     06/2007- L wrist- repair  . REPAIR ANKLE LIGAMENT     cleaned out cartilage     There were no vitals filed for this visit.  Subjective Assessment - 08/21/19 0802    Subjective  " On sunday I did some running and played some golf. I was pretty sore after that evening which I used the theragun to calm down"    Patient Stated Goals  Get rid of hip pain and return to running without limitation    Currently in Pain?  Yes    Pain Score  3     Pain Orientation  Left    Pain Descriptors / Indicators  Aching;Sore    Pain Onset  More than a month ago    Pain Frequency  Intermittent    Aggravating Factors   running/ golf    Pain Relieving Factors  rest         [facility] PT Assessment - 08/21/19 0001      Assessment   Medical Diagnosis  Left hip pain     Referring Provider (PT)  Barbaraann Barthel, Sharyn Lull, MD      Special Tests    Special Tests  Leg LengthTest    Leg length test   True;Apparent      True   Right  95 in.    Left   94.3 in.      Apparent   Right  106.2 in.    Left  105.5 in.                   [facility] PT Treatment/Exercise - 08/21/19 0001      Self-Care   Other Self-Care Comments   provided heel lift for the L shoulder to promote pelivc equality       Knee/Hip Exercises: Stretches   Active Hamstring Stretch  3 reps;30 seconds;Left      Knee/Hip Exercises: Aerobic   Elliptical  L5 x 5 min ramp L5      Knee/Hip Exercises: Machines for Strengthening   Hip Cybex  hip flexion LLE only to fatigue 35#, bil hip abduciton 1 x bil going  to fatigue   pt fatigues significantly faster with L hip abduction      Knee/Hip Exercises: Standing   Other Standing Knee Exercises  goblet squat 1 x 10 with 30#, modified to kettlebell 25# cus to avoid end range squat due to pt shifting hips to the R     Other Standing Knee Exercises  resisted hip extension 1 x 20 then at mid range osciillating back and forth to fatigue with black band      Manual Therapy   Joint Mobilization  grade V LAD LLE              PT Education - 08/21/19 0819    Education Details  discussed findings regarding leg length assessment. provided heel lift and discussed benefits and what to expect.    Person(s) Educated  Patient    Methods  Explanation;Verbal cues    Comprehension  Verbalized understanding;Verbal cues required       PT Short Term Goals - 07/24/19 0929      PT SHORT TERM GOAL #1   Title  independent with initial HEP    Time  4    Period  Weeks    Status  On-going    Target Date  07/30/19      PT SHORT TERM GOAL #2   Title  Patient will report pain level with activity </= 4/10 to improve activity tolerance.    Time  4    Period  Weeks    Status  On-going    Target Date  07/30/19      PT SHORT TERM GOAL #3   Title   Patient will report improved sleep to where he does not need any medication for sleeping due to pain and spasm    Time  4    Period  Weeks    Status  On-going    Target Date  07/30/19        PT Long Term Goals - 07/02/19 1722      PT LONG TERM GOAL #1   Title  independent with advanced HEP    Time  8    Period  Weeks    Status  New    Target Date  08/27/19      PT LONG TERM GOAL #2   Title  Patient will exhibit improved hip abduction strength to >/= 4+/5 to allow for improved hip control with running    Time  8    Period  Weeks    Status  New    Target Date  08/27/19      PT LONG TERM GOAL #3   Title  Patient will report no limitation with running or stairs due to left hip pain    Time  8    Period  Weeks    Status  New    Target Date  08/27/19      PT LONG TERM GOAL #4   Title  Patient will report no sleep disturbances due to left hip pain or spasm    Time  8    Period  Weeks    Status  New    Target Date  08/27/19            Plan - 08/21/19 0865    Clinical Impression Statement  pt reports he continues to have pain in the hip but notes it doesn't seem to be as severe. LLD assessment pt demonstrates .7cm difference between L/R with both true and apparent  with the LLE be shorter. provided heel lift for the L shoe. continued working on hip strengthening which he does good with but fatigues quickly. he has trouble stabilizing hips at end range squat demonstrating R lateral shift/ rotation but was able to correct with cues. plan to reassess next session.    PT Treatment/Interventions  ADLs/Self Care Home Management;Cryotherapy;Electrical Stimulation;Moist Heat;Neuromuscular re-education;Balance training;Therapeutic exercise;Therapeutic activities;Stair training;Gait training;Patient/family education;Manual techniques;Dry needling;Passive range of motion;Taping;Spinal Manipulations;Joint Manipulations    PT Next Visit Plan  reassess hip. assess response to dry needling,  hip abduction/extension activation and strengthening, continue rectus femoris and iliopsoas strengtheing, hows heel lift    PT Home Exercise Plan  [id number]: slide lying hip abduction with heel on wall, side clamshell with blue, prone hip extension with pillows under hips, side lying hip ER, standing hip airplanes, hip flexion iliopsoas strengthening.    Consulted and Agree with Plan of Care  Patient       Patient will benefit from skilled therapeutic intervention in order to improve the following deficits and impairments:  Decreased strength, Pain, Decreased balance, Improper body mechanics, Decreased activity tolerance  Visit Diagnosis: Pain in left hip  Muscle weakness (generalized)  Generalized abdominal pain  Cramp and spasm     Problem List Patient Active Problem List   Diagnosis Date Noted  . Right arm pain 09/08/2016  . Left Achilles tendinitis 09/21/2012    Starr Lake PT, DPT, LAT, ATC  08/21/19  8:45 AM      [facility] [address] [place], [place], [address] Phone: [phone]   Fax:  [phone]  Name: Montey Ebel MRN: [id number] Date of Birth: 1975-07-26      PHYSICAL THERAPY DISCHARGE SUMMARY  Visits from Start of Care: 7  Current functional level related to goals / functional outcomes: See goals,    Remaining deficits: As of last attended visit pt reported pain with extended running / walking in the L glute med/ min and external rotators. Pt messaged stating he was feeling good and felt he no longer needed skilled physical therapy, and could resume exercising independently.    Education / Equipment: HEP, theraband, posture, heel lift.   Plan: Patient agrees to discharge.  Patient goals were not met. Patient is being discharged due to meeting the stated rehab goals.  ?????         Mckenzye Cutright PT, DPT, LAT, ATC  08/27/19  9:07 AM

## 2019-08-21 NOTE — Progress Notes (Signed)
   Covid-19 Vaccination Clinic  Name:  Cory Alexander    MRN: [id number] DOB: 10-15-1975  08/21/2019  [name] was observed post Covid-19 immunization for 15 minutes without incident. He was provided with Vaccine Information Sheet and instruction to access the V-Safe system.   [name] was instructed to call 911 with any severe reactions post vaccine: Marland Kitchen Difficulty breathing  . Swelling of face and throat  . A fast heartbeat  . A bad rash all over body  . Dizziness and weakness   Immunizations Administered    Name Date Dose VIS Date Route   Pfizer COVID-19 Vaccine 08/21/2019 12:02 PM 0.3 mL 05/18/2019 Intramuscular   Manufacturer: [employer], [employer]   Lot: [id number]   NDC: [address]-0

## 2019-08-28 ENCOUNTER — Ambulatory Visit: Payer: 59 | Admitting: Physical Therapy

## 2019-09-19 ENCOUNTER — Ambulatory Visit: Payer: 59 | Admitting: Family Medicine

## 2019-09-26 ENCOUNTER — Ambulatory Visit: Payer: 59 | Admitting: Family Medicine

## 2019-09-26 ENCOUNTER — Other Ambulatory Visit: Payer: Self-pay

## 2019-09-26 ENCOUNTER — Encounter: Payer: Self-pay | Admitting: Family Medicine

## 2019-09-26 VITALS — BP 142/84 | Ht 72.0 in | Wt 205.0 lb

## 2019-09-26 DIAGNOSIS — M25552 Pain in left hip: Secondary | ICD-10-CM | POA: Diagnosis not present

## 2019-09-26 DIAGNOSIS — S86011A Strain of right Achilles tendon, initial encounter: Secondary | ICD-10-CM

## 2019-09-26 NOTE — Addendum Note (Signed)
Addended by: Rutha Bouchard E on: 09/26/2019 04:09 PM   Modules accepted: Orders

## 2019-09-26 NOTE — Progress Notes (Signed)
PCP: Darrow Bussing, MD  Subjective:   HPI: Patient is a 44 y.o. male here for left hip, right achilles pain.  3/3: Cory Alexander is here to follow-up on left hip pain.  He has been going to physical therapy and noticed improvement in his anterior hip, but little to no improvement in the posterior hip.  He is able to run, but after the run will have some pain.  He has moderate pain with just walking.  He is requiring gabapentin nightly to get good sleep.  He has tried nitro patches in the past for an Achilles injury, but did have moderate headaches.  He has not tried the diclofenac.  Denies any radiating symptoms into his foot.  Pain is still sharp and localized to the posterior lateral hip.  4/21: Patient reports his left hip feels about the same - posterior pain. Has only done 1 visit of PT since last visit as he injured his right achilles about 3 weeks ago playing pickle ball Associated swelling and soreness. No bruising.   Difficulty doing a calf raise. Tried running 2 miles and had a lot of difficulty due to pain. He's wearing a heel lift on left side now as PT noted a small leg length discrepancy.  Past Medical History:  Diagnosis Date  . Anxiety   . Neuromuscular disorder (HCC)    L arm, bicep tendon injury, R hamstring injured     Current Outpatient Medications on File Prior to Visit  Medication Sig Dispense Refill  . CIALIS 20 MG tablet     . diclofenac (VOLTAREN) 75 MG EC tablet Take 1 tablet (75 mg total) by mouth 2 (two) times daily. 60 tablet 1  . gabapentin (NEURONTIN) 300 MG capsule Take 1 capsule (300 mg total) by mouth at bedtime. 30 capsule 2  . PARoxetine (PAXIL) 10 MG tablet      No current facility-administered medications on file prior to visit.    Past Surgical History:  Procedure Laterality Date  . APPENDECTOMY     2005  . DISTAL BICEPS TENDON REPAIR  02/08/2012   Procedure: DISTAL BICEPS TENDON REPAIR;  Surgeon: Cammy Copa, MD;  Location: [facility];   Service: Orthopedics;  Laterality: Left;  Left distal biceps tendon repair  . FRACTURE SURGERY     06/2007- L wrist- repair  . REPAIR ANKLE LIGAMENT     cleaned out cartilage     No Known Allergies  Social History   Socioeconomic History  . Marital status: Married    Spouse name: Not on file  . Number of children: Not on file  . Years of education: Not on file  . Highest education level: Not on file  Occupational History  . Not on file  Tobacco Use  . Smoking status: Never Smoker  . Smokeless tobacco: Never Used  Substance and Sexual Activity  . Alcohol use: Yes    Comment: social   . Drug use: No  . Sexual activity: Not on file  Other Topics Concern  . Not on file  Social History Narrative  . Not on file   Social Determinants of Health   Financial Resource Strain:   . Difficulty of Paying Living Expenses:   Food Insecurity:   . Worried About Programme researcher, broadcasting/film/video in the Last Year:   . Barista in the Last Year:   Transportation Needs:   . Freight forwarder (Medical):   Marland Kitchen Lack of Transportation (Non-Medical):   Physical Activity:   .  Days of Exercise per Week:   . Minutes of Exercise per Session:   Stress:   . Feeling of Stress :   Social Connections:   . Frequency of Communication with Friends and Family:   . Frequency of Social Gatherings with Friends and Family:   . Attends Religious Services:   . Active Member of Clubs or Organizations:   . Attends Archivist Meetings:   Marland Kitchen Marital Status:   Intimate Partner Violence:   . Fear of Current or Ex-Partner:   . Emotionally Abused:   Marland Kitchen Physically Abused:   . Sexually Abused:     Family History  Problem Relation Age of Onset  . Heart attack Father   . Diabetes Neg Hx   . Hyperlipidemia Neg Hx   . Hypertension Neg Hx   . Sudden death Neg Hx     BP (!) 142/84   Ht 6' (1.829 m)   Wt 205 lb (93 kg)   BMI 27.80 kg/m   Review of Systems: See HPI above.     Objective:  Physical  Exam:  Gen: NAD, comfortable in exam room  Right foot/ankle: No gross deformity, swelling, ecchymoses FROM with 5/5 strength while seated.  Cannot do single leg calf raise. TTP about 3 cm proximal to calcaneus within achilles. Negative ant drawer and talar tilt.   Negative syndesmotic compression. Negative calcaneal squeeze. Thompsons test negative. NV intact distally.  Left hip: No deformity. FROM with 5/5 strength except 5-/5 hip abduction. Mild tenderness to palpation posterior greater trochanter at ext rotator insertions. NVI distally. Negative logroll Negative faber, fadir.   Right achilles u/s:  Thickening in area of pain with about 10% of tendon cross sectional area hypoechoic on lateral aspect.  Assessment & Plan:  1. Right achilles strain - icing, nsaids, heel lifts, home exercises reviewed.  Avoid uneven ground, hills.  Consider PT, orthotics, nitro patches.  F/u in 4 weeks.  2. Left hip pain - exam again reassuring against intraarticular pathology though his pain persists.  He will restart physical therapy.  Consider MR arthrogram if he continues to struggle.

## 2019-09-26 NOTE — Patient Instructions (Signed)
You strained your right achilles. Ibuprofen 600mg  three times a day with food OR aleve 2 tabs twice a day with food for pain and inflammation only if needed. Calf raises 3 sets of 10 on level ground once a day first. When these are easy, can do them one legged 3 sets of 10. Finally advance to doing them on a step. Can add heel walks, toe walks forward and backward as well Ice bucket 10-15 minutes at end of day - can ice 3-4 times a day. Avoid uneven ground, hills as much as possible. Heel lifts in shoes or shoes with a natural heel lift. Consider physical therapy, orthotics, nitro patches if not improving as expected. Follow up in 4 weeks.  Restart rehab for your left hip - call me if you need anything from this perspective. We discussed consideration of an MRI arthrogram though again your exam is reassuring against a tear of the cartilage of th hip or anything else bad that an MRI would pick up.

## 2019-10-18 ENCOUNTER — Other Ambulatory Visit: Payer: Self-pay

## 2019-10-18 ENCOUNTER — Ambulatory Visit: Payer: No Typology Code available for payment source | Attending: Surgery | Admitting: Physical Therapy

## 2019-10-18 ENCOUNTER — Encounter: Payer: Self-pay | Admitting: Physical Therapy

## 2019-10-18 DIAGNOSIS — M25552 Pain in left hip: Secondary | ICD-10-CM

## 2019-10-18 DIAGNOSIS — M25521 Pain in right elbow: Secondary | ICD-10-CM | POA: Diagnosis present

## 2019-10-18 DIAGNOSIS — M6281 Muscle weakness (generalized): Secondary | ICD-10-CM | POA: Diagnosis present

## 2019-10-18 NOTE — Therapy (Signed)
[place], [place], [address] Phone: [phone]   Fax:  [phone]  Physical Therapy Evaluation  Patient Details  Name: Cory Alexander MRN: [id number] Date of Birth: 05/16/1976 Referring Provider (PT): Dene Gentry, MD   Encounter Date: 10/18/2019  PT End of Session - 10/18/19 1545    Visit Number  1    Number of Visits  13    Date for PT Re-Evaluation  12/13/19    Authorization Type  Aetna    PT Start Time  1545    PT Stop Time  1630    PT Time Calculation (min)  45 min    Activity Tolerance  Patient tolerated treatment well    Behavior During Therapy  [facility] for tasks assessed/performed       Past Medical History:  Diagnosis Date  . Anxiety   . Neuromuscular disorder (HCC)    L arm, bicep tendon injury, R hamstring injured     Past Surgical History:  Procedure Laterality Date  . APPENDECTOMY     2005  . DISTAL BICEPS TENDON REPAIR  02/08/2012   Procedure: DISTAL BICEPS TENDON REPAIR;  Surgeon: Meredith Pel, MD;  Location: [place];  Service: Orthopedics;  Laterality: Left;  Left distal biceps tendon repair  . FRACTURE SURGERY     06/2007- L wrist- repair  . REPAIR ANKLE LIGAMENT     cleaned out cartilage     There were no vitals filed for this visit.   Subjective Assessment - 10/18/19 1548    Subjective  pt returns to physical therapy with reporting having R achilles pain that started as a result of playing pickle ball which he fell and could have caused the issue in the achilles. He reports continued L posterior hip pain that has been fairly constand since he was seen last epsiode it has stayed around 3/10 that is worse with prolonged walking. He reports intense R elbow pain that has started sicne the play pickle ball which is fairly intense and reports.    Currently in Pain?  Yes    Pain Score  3     Pain Location  Hip    Pain Orientation  Left;Posterior    Pain Descriptors /  Indicators  Aching;Sore    Pain Type  Chronic pain    Pain Onset  More than a month ago    Pain Frequency  Constant    Aggravating Factors   elevated walking/ hiking,    Pain Relieving Factors  theragun, ibuprofen    Multiple Pain Sites  Yes    Pain Score  7    Pain Location  Elbow    Pain Orientation  Right;Lateral    Pain Descriptors / Indicators  Aching;Sharp    Pain Type  Chronic pain    Pain Onset  More than a month ago    Pain Frequency  Intermittent    Aggravating Factors   gripping, extending the arm    Pain Relieving Factors  ice, brace         [facility] PT Assessment - 10/18/19 0001      Assessment   Medical Diagnosis  Left hip pain, R elbow pain    Referring Provider (PT)  Dene Gentry, MD    Onset Date/Surgical Date  02/06/19   R elbow 09/04/2019   Hand Dominance  Left    Next MD Visit  08/08/2019    Prior Therapy  Yes  Precautions   Precautions  None      Restrictions   Weight Bearing Restrictions  No      Balance Screen   Has the patient fallen in the past 6 months  No      Home Environment   Living Environment  Private residence    Living Arrangements  Spouse/significant other      Prior Function   Level of Independence  Independent    Vocation  Full time employment    Leisure  Running, swimming, hiking, golfing      Cognition   Overall Cognitive Status  Within Functional Limits for tasks assessed      Observation/Other Assessments   Focus on Therapeutic Outcomes (FOTO)   40% limited   24% limited predicted     ROM / Strength   AROM / PROM / Strength  AROM;Strength      AROM   Overall AROM Comments  repeeated extension pt reported improvement in hip  pain standing or doing standing hip ER/IR      PROM   Overall PROM Comments  Hip PROM grossly WFL, he did report concordant pain with left ER at 90 deg flexion      Strength   Strength Assessment Site  Elbow;Wrist    Right/Left Elbow  Right;Left    Right Elbow Flexion  4+/5    Right  Elbow Extension  4+/5    Right/Left Wrist  Right;Left    Left Wrist Flexion  4+/5    Left Wrist Extension  4+/5   concordant pain during testiong at the lateral epicondyle   Right Hip External Rotation   4+/5    Right Hip Internal Rotation  5/5    Left Hip Flexion  5/5    Left Hip Extension  4/5    Left Hip External Rotation  4/5   concordant pain during testing   Left Hip Internal Rotation  4/5    Left Hip ABduction  4/5      Palpation   SI assessment   sacral Ala on the L appears deeper in palpation  and the R inferior sacral angle is more suprior suggesting a potential R  on R sacral torsion    Palpation comment  TTP along the piriformis and glute med/ min, soreness along the carpi radialis brevis and common extenors/ supinator on the R and at the lateral epicondyle.                  Objective measurements completed on examination: See above findings.      [facility] Adult PT Treatment/Exercise - 10/18/19 0001      Wrist Exercises   Other wrist exercises  wrist flexor / extenor stretching 2 x 30 sec      Manual Therapy   Manual therapy comments  Skilled palpation for TPDN and monitoring pt    Joint Mobilization  R sacral inferior angle PA grade III mobs    Other Manual Therapy  cho-pat strp for lateral epicondyle       Trigger Point Dry Needling - 10/18/19 0001    Consent Given?  Yes    Education Handout Provided  Previously provided    Muscles Treated Wrist/Hand  Extensor carpi radialis longus/brevis    Extensor carpi radialis longus/brevis Response  Twitch response elicited;Palpable increased muscle length   R only          PT Education - 10/18/19 1646    Education Details  evaluation findings, POC, goals, HEP  with proper form/ rationale. anatomy of the elbow and hip. reviewed TPDN and benefits    Person(s) Educated  Patient    Methods  Explanation;Verbal cues;Handout    Comprehension  Verbalized understanding;Verbal cues required       PT Short  Term Goals - 10/18/19 1748      PT SHORT TERM GOAL #1   Title  independent with initial HEP    Time  4    Period  Weeks    Status  New    Target Date  11/15/19      PT SHORT TERM GOAL #2   Title  -      PT SHORT TERM GOAL #3   Title  -        PT Long Term Goals - 10/18/19 1748      PT LONG TERM GOAL #1   Title  pt to report </= 2/10 R elbow pain with liftin and gripping for improvement in condition    Time  8    Period  Weeks    Status  New    Target Date  12/13/19      PT LONG TERM GOAL #2   Title  Patient will exhibit improved hip abduction extensor strength to >/= 5/5 to allow for improved hip control with running    Time  8    Period  Weeks    Status  New    Target Date  12/13/19      PT LONG TERM GOAL #3   Title  Patient will report no limitation with running or stairs due to left hip pain    Time  8    Period  Weeks    Status  New    Target Date  12/13/19      PT LONG TERM GOAL #4   Title  increase FOTO score to </=24% limited to demo improvement in function    Time  8    Period  Weeks    Status  New    Target Date  12/13/19      PT LONG TERM GOAL #5   Title  pt to I with all HEp given to maintain and progress current level of function    Time  8    Period  Weeks    Status  New    Target Date  12/13/19             Plan - 10/18/19 1739    Clinical Impression Statement  pt returns back to O[facility]T with continued L posterior hip pain that has been constant since he was discharged and R lateral elbow pain that started following playing pickle ball. he has functional hip/ elbow ROM, and mild posterior hip weakness on the L with reproduction of concordant hip pain with resisted ER. Palpation revealed potential scaral R on R torsion, and repeated trunk extension did reduce standing hip pain with ER/IR motions, however, had limited pain on evaluation and further testing may be needed to rule out potential back involvement. R elbow he demonstrates increased  tenson in the common extensors and tenderness at the lateral epicondyle suggesing high probabiity of lateral epicondylities. he would benefit from physical therapy to decrease L hip and R elbow pain, increase hip strength, and maximize his function by addressing the deficits listed.    Personal Factors and Comorbidities  Time since onset of injury/illness/exacerbation;Past/Current Experience;Comorbidity 1    Comorbidities  hx of chronic L hip pain  Examination-Activity Limitations  Other;Lift   running   Stability/Clinical Decision Making  Evolving/Moderate complexity    Clinical Decision Making  Moderate    Rehab Potential  Good    PT Frequency  1x / week    PT Duration  8 weeks    PT Treatment/Interventions  ADLs/Self Care Home Management;Cryotherapy;Electrical Stimulation;Moist Heat;Neuromuscular re-education;Balance training;Therapeutic exercise;Therapeutic activities;Stair training;Gait training;Patient/family education;Manual techniques;Dry needling;Passive range of motion;Taping;Spinal Manipulations;Joint Manipulations;Iontophoresis 4mg /ml Dexamethasone;Traction;Ultrasound    PT Next Visit Plan  review/ update HEP PRN,assess grip strength, assess response to DN for elbow, trial for piriformis, potential R on R sacral torsion - R inferior sacral mobs and R piriformis strengthening, elbow mobs for lateral epicondaylitis.  continue to trial extension and rule out lumbar spine    PT Home Exercise Plan  AACAXMEN - standing trunk extension, piriformis stretch (on the L), hip ER on the R only, wrist flexor/ extensor stretch, supination/ pronation    Consulted and Agree with Plan of Care  Patient       Patient will benefit from skilled therapeutic intervention in order to improve the following deficits and impairments:  Decreased strength, Pain, Decreased balance, Improper body mechanics, Decreased activity tolerance  Visit Diagnosis: Pain in left hip  Muscle weakness (generalized)  Pain in  right elbow     Problem List Patient Active Problem List   Diagnosis Date Noted  . Right arm pain 09/08/2016  . Left Achilles tendinitis 09/21/2012   09/23/2012 PT, DPT, LAT, ATC  10/18/19  5:53 PM      [facility] [address] [place], [place], [place] Phone: [phone]   Fax:  [phone]  Name: Cory Alexander MRN: Ardelle Lesches Date of Birth: 1975/07/23

## 2019-10-29 ENCOUNTER — Ambulatory Visit: Payer: 59 | Admitting: Family Medicine

## 2019-10-29 ENCOUNTER — Other Ambulatory Visit: Payer: Self-pay

## 2019-10-29 ENCOUNTER — Encounter: Payer: Self-pay | Admitting: Family Medicine

## 2019-10-29 VITALS — BP 132/82 | Ht 72.0 in | Wt 210.0 lb

## 2019-10-29 DIAGNOSIS — S86012D Strain of left Achilles tendon, subsequent encounter: Secondary | ICD-10-CM | POA: Diagnosis not present

## 2019-10-29 DIAGNOSIS — M25552 Pain in left hip: Secondary | ICD-10-CM | POA: Diagnosis not present

## 2019-10-29 DIAGNOSIS — M7711 Lateral epicondylitis, right elbow: Secondary | ICD-10-CM

## 2019-10-29 NOTE — Patient Instructions (Signed)
You have lateral epicondylitis Try to avoid painful activities as much as possible. Ice the area 3-4 times a day for 15 minutes at a time. Tylenol or aleve as needed for pain. Counterforce brace as directed can help unload area - wear this regularly if it provides you with relief. Hammer rotation exercise, wrist extension exercise with 1 pound weight - 3 sets of 10 once a day.   Stretching - hold for 20-30 seconds and repeat 3 times. Continue physical therapy. Consider wrist brace as we discussed. Consider nitro patches, injection as well. Follow up in 6 weeks otherwise.

## 2019-10-29 NOTE — Progress Notes (Signed)
PCP: Lujean Amel, MD  Subjective:   HPI: Patient is a 44 y.o. male here for left hip, right achilles, right elbow pain.  3/3: Cory Alexander is here to follow-up on left hip pain.  He has been going to physical therapy and noticed improvement in his anterior hip, but little to no improvement in the posterior hip.  He is able to run, but after the run will have some pain.  He has moderate pain with just walking.  He is requiring gabapentin nightly to get good sleep.  He has tried nitro patches in the past for an Achilles injury, but did have moderate headaches.  He has not tried the diclofenac.  Denies any radiating symptoms into his foot.  Pain is still sharp and localized to the posterior lateral hip.  4/21: Patient reports his left hip feels about the same - posterior pain. Has only done 1 visit of PT since last visit as he injured his right achilles about 3 weeks ago playing pickle ball Associated swelling and soreness. No bruising.   Difficulty doing a calf raise. Tried running 2 miles and had a lot of difficulty due to pain. He's wearing a heel lift on left side now as PT noted a small leg length discrepancy.  5/24: Patient reports his right achilles and left hip are improving. His main complaint is worsened lateral right elbow pain. Noted after playing pickleball. Has done stretching and needling once at PT. No numbness. No acute injury.  Past Medical History:  Diagnosis Date  . Anxiety   . Neuromuscular disorder (HCC)    L arm, bicep tendon injury, R hamstring injured     Current Outpatient Medications on File Prior to Visit  Medication Sig Dispense Refill  . CIALIS 20 MG tablet     . diclofenac (VOLTAREN) 75 MG EC tablet Take 1 tablet (75 mg total) by mouth 2 (two) times daily. 60 tablet 1  . gabapentin (NEURONTIN) 300 MG capsule Take 1 capsule (300 mg total) by mouth at bedtime. 30 capsule 2  . PARoxetine (PAXIL) 10 MG tablet      No current facility-administered  medications on file prior to visit.    Past Surgical History:  Procedure Laterality Date  . APPENDECTOMY     2005  . DISTAL BICEPS TENDON REPAIR  02/08/2012   Procedure: DISTAL BICEPS TENDON REPAIR;  Surgeon: Meredith Pel, MD;  Location: [place];  Service: Orthopedics;  Laterality: Left;  Left distal biceps tendon repair  . FRACTURE SURGERY     06/2007- L wrist- repair  . REPAIR ANKLE LIGAMENT     cleaned out cartilage     No Known Allergies  Social History   Socioeconomic History  . Marital status: Married    Spouse name: Not on file  . Number of children: Not on file  . Years of education: Not on file  . Highest education level: Not on file  Occupational History  . Not on file  Tobacco Use  . Smoking status: Never Smoker  . Smokeless tobacco: Never Used  Substance and Sexual Activity  . Alcohol use: Yes    Comment: social   . Drug use: No  . Sexual activity: Not on file  Other Topics Concern  . Not on file  Social History Narrative  . Not on file   Social Determinants of Health   Financial Resource Strain:   . Difficulty of Paying Living Expenses:   Food Insecurity:   . Worried About Running  Out of Food in the Last Year:   . Ran Out of Food in the Last Year:   Transportation Needs:   . Lack of Transportation (Medical):   Marland Kitchen Lack of Transportation (Non-Medical):   Physical Activity:   . Days of Exercise per Week:   . Minutes of Exercise per Session:   Stress:   . Feeling of Stress :   Social Connections:   . Frequency of Communication with Friends and Family:   . Frequency of Social Gatherings with Friends and Family:   . Attends Religious Services:   . Active Member of Clubs or Organizations:   . Attends Banker Meetings:   Marland Kitchen Marital Status:   Intimate Partner Violence:   . Fear of Current or Ex-Partner:   . Emotionally Abused:   Marland Kitchen Physically Abused:   . Sexually Abused:     Family History  Problem Relation Age of Onset  . Heart  attack Father   . Diabetes Neg Hx   . Hyperlipidemia Neg Hx   . Hypertension Neg Hx   . Sudden death Neg Hx     BP 132/82   Ht 6' (1.829 m)   Wt 210 lb (95.3 kg)   BMI 28.48 kg/m   Review of Systems: See HPI above.     Objective:  Physical Exam:  Gen: NAD, comfortable in exam room  Right elbow: No deformity, swelling. FROM with 5/5 strength all motions.  Pain with resisted wrist extension and 3rd digit extension. TTP lateral epicondyle.  No other tenderness. NVI distally. Collateral ligaments intact.  Assessment & Plan:  1. Right lateral epicondylitis - Shown home exercises to do daily.  Continue physical therapy.  Counterforce brace, consider wrist brace.  Icing, tylenol or aleve.  Consider nitro patches, injection, PRP.  F/u in 6 weeks.  2. Right achilles strain - improved.  3. Left hip pain - continues to improve with physical therapy.  Continue this and home exercises.  Consider MR arthrogram if does not continue to improve.

## 2019-11-02 ENCOUNTER — Encounter: Payer: Self-pay | Admitting: Physical Therapy

## 2019-11-02 ENCOUNTER — Ambulatory Visit: Payer: No Typology Code available for payment source | Admitting: Physical Therapy

## 2019-11-02 ENCOUNTER — Other Ambulatory Visit: Payer: Self-pay

## 2019-11-02 DIAGNOSIS — M6281 Muscle weakness (generalized): Secondary | ICD-10-CM

## 2019-11-02 DIAGNOSIS — M25521 Pain in right elbow: Secondary | ICD-10-CM

## 2019-11-02 DIAGNOSIS — M25552 Pain in left hip: Secondary | ICD-10-CM

## 2019-11-02 NOTE — Therapy (Signed)
[place], [place], [address] Phone: [phone]   Fax:  [phone]  Physical Therapy Treatment  Patient Details  Name: Cory Alexander MRN: [id number] Date of Birth: 03/27/1976 Referring Provider (PT): Dene Gentry, MD   Encounter Date: 11/02/2019  PT End of Session - 11/02/19 0748    Visit Number  2    Number of Visits  13    Date for PT Re-Evaluation  12/13/19    Authorization Type  Aetna    PT Start Time  [phone]    PT Stop Time  0829    PT Time Calculation (min)  41 min    Activity Tolerance  Patient tolerated treatment well    Behavior During Therapy  [facility] for tasks assessed/performed       Past Medical History:  Diagnosis Date  . Anxiety   . Neuromuscular disorder (HCC)    L arm, bicep tendon injury, R hamstring injured     Past Surgical History:  Procedure Laterality Date  . APPENDECTOMY     2005  . DISTAL BICEPS TENDON REPAIR  02/08/2012   Procedure: DISTAL BICEPS TENDON REPAIR;  Surgeon: Meredith Pel, MD;  Location: [place];  Service: Orthopedics;  Laterality: Left;  Left distal biceps tendon repair  . FRACTURE SURGERY     06/2007- L wrist- repair  . REPAIR ANKLE LIGAMENT     cleaned out cartilage     There were no vitals filed for this visit.  Subjective Assessment - 11/02/19 0749    Subjective  "the hip is doing pretty good, I can still feel it but its pretty light"    Patient Stated Goals  Get rid of hip pain and return to running without limitation    Currently in Pain?  Yes    Pain Score  2     Pain Orientation  Left    Aggravating Factors   prolonged walking/ hiking    Pain Score  9    Pain Location  Elbow    Pain Orientation  Right    Pain Descriptors / Indicators  Aching    Pain Type  Chronic pain    Pain Frequency  Intermittent    Aggravating Factors   griiping, reaching         [facility] PT Assessment - 11/02/19 0001      Assessment   Medical Diagnosis  Left  hip pain, R elbow pain    Referring Provider (PT)  Dene Gentry, MD      Strength   Strength Assessment Site  Hand    Right Hand Grip (lbs)  116   109,116,124   Left Hand Grip (lbs)  89   81,87,99                   [facility] Adult PT Treatment/Exercise - 11/02/19 0001      Wrist Exercises   Other wrist exercises  assist with wrist extension with focus on controlled eccentric lowering with red weighted ball   only 1 set due to significant soreness.   Other wrist exercises  wrist flexor / extenor stretching 2 x 30 sec      Manual Therapy   Joint Mobilization  R lateral elbow gapping with gripping for MWM grade III    Soft tissue mobilization  IASTM along the common extensors     Myofascial Release  fascial rolling along the common extensor        Trigger  Point Dry Needling - 11/02/19 0001    Consent Given?  Yes    Education Handout Provided  Previously provided    Electrical Stimulation Performed with Dry Needling  Yes    E-stim with Dry Needling Details  frequency at 15 x 8 min     Extensor carpi radialis longus/brevis Response  Twitch response elicited;Palpable increased muscle length           PT Education - 11/02/19 0912    Education Details  reviewed ionotphoresis and benefits/ length of wear.    Person(s) Educated  Patient    Methods  Explanation;Verbal cues    Comprehension  Verbalized understanding;Verbal cues required       PT Short Term Goals - 10/18/19 1748      PT SHORT TERM GOAL #1   Title  independent with initial HEP    Time  4    Period  Weeks    Status  New    Target Date  11/15/19      PT SHORT TERM GOAL #2   Title  -      PT SHORT TERM GOAL #3   Title  -        PT Long Term Goals - 10/18/19 1748      PT LONG TERM GOAL #1   Title  pt to report </= 2/10 R elbow pain with liftin and gripping for improvement in condition    Time  8    Period  Weeks    Status  New    Target Date  12/13/19      PT LONG TERM GOAL #2    Title  Patient will exhibit improved hip abduction extensor strength to >/= 5/5 to allow for improved hip control with running    Time  8    Period  Weeks    Status  New    Target Date  12/13/19      PT LONG TERM GOAL #3   Title  Patient will report no limitation with running or stairs due to left hip pain    Time  8    Period  Weeks    Status  New    Target Date  12/13/19      PT LONG TERM GOAL #4   Title  increase FOTO score to </=24% limited to demo improvement in function    Time  8    Period  Weeks    Status  New    Target Date  12/13/19      PT LONG TERM GOAL #5   Title  pt to I with all HEp given to maintain and progress current level of function    Time  8    Period  Weeks    Status  New    Target Date  12/13/19            Plan - 11/02/19 0912    Clinical Impression Statement  pt reports the posterior hips feels like it is improving but hasn't challeneged it with running. He continues to have signficiant pain inthe R lateral elbow. per pt request focused on elbow today with TPDN, IASTM [employer] and lateral elbow mobs. He was able to complete eccentric loading but reported signfiicant soreness so only performed 1 set. educated and consent was provided to apply iontophoresis on the lateral epicondyle.    PT Treatment/Interventions  ADLs/Self Care Home Management;Cryotherapy;Electrical Stimulation;Moist Heat;Neuromuscular re-education;Balance training;Therapeutic exercise;Therapeutic activities;Stair training;Gait training;Patient/family education;Manual techniques;Dry needling;Passive range of  motion;Taping;Spinal Manipulations;Joint Manipulations;Iontophoresis 4mg /ml Dexamethasone;Traction;Ultrasound    PT Home Exercise Plan  AACAXMEN - standing trunk extension, piriformis stretch (on the L), hip ER on the R only, wrist flexor/ extensor stretch, supination/ pronation    Consulted and Agree with Plan of Care  Patient       Patient will benefit from skilled  therapeutic intervention in order to improve the following deficits and impairments:     Visit Diagnosis: Pain in left hip  Muscle weakness (generalized)  Pain in right elbow     Problem List Patient Active Problem List   Diagnosis Date Noted  . Right arm pain 09/08/2016  . Left Achilles tendinitis 09/21/2012   09/23/2012 PT, DPT, LAT, ATC  11/02/19  9:22 AM      [facility] [address] [place], [place], [place] Phone: [phone]   Fax:  [phone]  Name: Cory Alexander MRN: Ardelle Lesches Date of Birth: 10/08/1975

## 2019-11-02 NOTE — Patient Instructions (Signed)

## 2019-11-09 ENCOUNTER — Encounter: Payer: Self-pay | Admitting: Physical Therapy

## 2019-11-09 ENCOUNTER — Other Ambulatory Visit: Payer: Self-pay

## 2019-11-09 ENCOUNTER — Ambulatory Visit: Payer: No Typology Code available for payment source | Attending: Surgery | Admitting: Physical Therapy

## 2019-11-09 DIAGNOSIS — M25552 Pain in left hip: Secondary | ICD-10-CM | POA: Diagnosis present

## 2019-11-09 DIAGNOSIS — R252 Cramp and spasm: Secondary | ICD-10-CM | POA: Diagnosis present

## 2019-11-09 DIAGNOSIS — R1084 Generalized abdominal pain: Secondary | ICD-10-CM | POA: Diagnosis present

## 2019-11-09 DIAGNOSIS — M25521 Pain in right elbow: Secondary | ICD-10-CM | POA: Diagnosis present

## 2019-11-09 DIAGNOSIS — M6281 Muscle weakness (generalized): Secondary | ICD-10-CM | POA: Diagnosis present

## 2019-11-09 NOTE — Therapy (Signed)
[facility] [address] [place], [place], [address] Phone: [phone]   Fax:  [phone]  Physical Therapy Treatment  Patient Details  Name: Cory Alexander MRN: [id number] Date of Birth: April 04, 1976 Referring Provider (PT): Lenda Kelp, MD   Encounter Date: 11/09/2019  PT End of Session - 11/09/19 0747    Visit Number  3    Number of Visits  13    Date for PT Re-Evaluation  12/13/19    Authorization Type  A[employer]    PT Start Time  [phone]    PT Stop Time  0828    PT Time Calculation (min)  41 min    Activity Tolerance  Patient tolerated treatment well    Behavior During Therapy  [facility] for tasks assessed/performed       Past Medical History:  Diagnosis Date  . Anxiety   . Neuromuscular disorder (HCC)    L arm, bicep tendon injury, R hamstring injured     Past Surgical History:  Procedure Laterality Date  . APPENDECTOMY     2005  . DISTAL BICEPS TENDON REPAIR  02/08/2012   Procedure: DISTAL BICEPS TENDON REPAIR;  Surgeon: Cammy Copa, MD;  Location: [facility] OR;  Service: Orthopedics;  Laterality: Left;  Left distal biceps tendon repair  . FRACTURE SURGERY     06/2007- L wrist- repair  . REPAIR ANKLE LIGAMENT     cleaned out cartilage     There were no vitals filed for this visit.  Subjective Assessment - 11/09/19 0748    Subjective  "The patch helped alot. Friday night I felt alot better,    Currently in Pain?  Yes    Pain Score  3     Pain Location  Hip    Pain Orientation  Left    Pain Onset  More than a month ago    Aggravating Factors   running/ prolonged walking    Multiple Pain Sites  Yes    Pain Score  6    Pain Orientation  Right    Pain Descriptors / Indicators  Aching    Pain Type  Chronic pain    Pain Onset  More than a month ago    Pain Frequency  Intermittent    Aggravating Factors   gripping, reaching,    Pain Relieving Factors  ice, brace,                        [facility]  Adult PT Treatment/Exercise - 11/09/19 0001      Wrist Exercises   Wrist Extension  Strengthening;Right   unweighted focusing on controlled con/ ecc loading   Other wrist exercises  assist with wrist extension with focus on controlled eccentric lowering with red weighted ball 1 x 10 in supine, 1 x 10 in sitting    Other wrist exercises  wrist flexor / extenor stretching 2 x 30 sec      Modalities   Modalities  Iontophoresis      Iontophoresis   Type of Iontophoresis  Dexamethasone    Location  R lateral epicondyle    Dose  4mg /ml    Time  6 hour patch      Manual Therapy   Manual therapy comments  Skilled palpation for TPDN and monitoring pt    Joint Mobilization  R lateral elbow gapping with gripping for MWM grade III    Soft tissue mobilization  IASTM along the common extensors  Myofascial Release  fascial rolling along the common extensor        Trigger Point Dry Needling - 11/09/19 0001    Consent Given?  Yes    Education Handout Provided  Previously provided    Muscles Treated Upper Quadrant  Supinator    E-stim with Dry Needling Details  frequency at 15 x 8 min     Supinator Response  Twitch response elicited;Palpable increased muscle length   R only   Extensor carpi radialis longus/brevis Response  Twitch response elicited;Palpable increased muscle length             PT Short Term Goals - 10/18/19 1748      PT SHORT TERM GOAL #1   Title  independent with initial HEP    Time  4    Period  Weeks    Status  New    Target Date  11/15/19      PT SHORT TERM GOAL #2   Title  -      PT SHORT TERM GOAL #3   Title  -        PT Long Term Goals - 10/18/19 1748      PT LONG TERM GOAL #1   Title  pt to report </= 2/10 R elbow pain with liftin and gripping for improvement in condition    Time  8    Period  Weeks    Status  New    Target Date  12/13/19      PT LONG TERM GOAL #2   Title  Patient will exhibit improved hip abduction extensor strength to  >/= 5/5 to allow for improved hip control with running    Time  8    Period  Weeks    Status  New    Target Date  12/13/19      PT LONG TERM GOAL #3   Title  Patient will report no limitation with running or stairs due to left hip pain    Time  8    Period  Weeks    Status  New    Target Date  12/13/19      PT LONG TERM GOAL #4   Title  increase FOTO score to </=24% limited to demo improvement in function    Time  8    Period  Weeks    Status  New    Target Date  12/13/19      PT LONG TERM GOAL #5   Title  pt to I with all HEp given to maintain and progress current level of function    Time  8    Period  Weeks    Status  New    Target Date  12/13/19            Plan - 11/09/19 0827    Clinical Impression Statement  continued focusing on the R lateral elbow due to continued pain, however he noted signficiant relief following previous sesson. continued TPDN focusing on the common extensors and supinator combine dwith E-stim followed with IASTM techniques and mobilizaion with movement. continued eccentric loading of the commen extensor, and ended session with another application of iontophoresis.    PT Next Visit Plan  review/ update HEP PRN,assess response to DN for elbow, trial for piriformis, potential R on R sacral torsion - R inferior sacral mobs and R piriformis strengthening, elbow mobs for lateral epicondaylitis. continue Ionto for lateral epicondyle if continues to respond well.  PT Home Exercise Plan  AACAXMEN - standing trunk extension, piriformis stretch (on the L), hip ER on the R only, wrist flexor/ extensor stretch, supination/ pronation       Patient will benefit from skilled therapeutic intervention in order to improve the following deficits and impairments:     Visit Diagnosis: Pain in left hip  Muscle weakness (generalized)  Pain in right elbow     Problem List Patient Active Problem List   Diagnosis Date Noted  . Right arm pain 09/08/2016  .  Left Achilles tendinitis 09/21/2012   Lulu Riding PT, DPT, LAT, ATC  11/09/19  8:31 AM      [facility] [address] [place], [place], [address] Phone: [phone]   Fax:  [phone]  Name: Cory Alexander MRN: [id number] Date of Birth: 1976-03-17

## 2019-11-16 ENCOUNTER — Ambulatory Visit: Payer: No Typology Code available for payment source | Admitting: Physical Therapy

## 2019-11-16 ENCOUNTER — Encounter: Payer: Self-pay | Admitting: Physical Therapy

## 2019-11-16 ENCOUNTER — Other Ambulatory Visit: Payer: Self-pay

## 2019-11-16 DIAGNOSIS — M25552 Pain in left hip: Secondary | ICD-10-CM | POA: Diagnosis not present

## 2019-11-16 DIAGNOSIS — M6281 Muscle weakness (generalized): Secondary | ICD-10-CM

## 2019-11-16 DIAGNOSIS — R1084 Generalized abdominal pain: Secondary | ICD-10-CM

## 2019-11-16 DIAGNOSIS — R252 Cramp and spasm: Secondary | ICD-10-CM

## 2019-11-16 DIAGNOSIS — M25521 Pain in right elbow: Secondary | ICD-10-CM

## 2019-11-16 NOTE — Therapy (Signed)
[facility] Out[facility] Rehabilitation [facility] [address] [place], [place], [address] Phone: [phone]   Fax:  [phone]  Physical Therapy Treatment  Patient Details  Name: Cory Alexander MRN: [id number] Date of Birth: Apr 27, 1976 Referring Provider (PT): Lenda Kelp, MD   Encounter Date: 11/16/2019   PT End of Session - 11/16/19 0931    Visit Number 4    Number of Visits 13    Date for PT Re-Evaluation 12/13/19    Authorization Type Aetna    PT Start Time 0755    PT Stop Time 0843    PT Time Calculation (min) 48 min    Activity Tolerance Patient tolerated treatment well    Behavior During Therapy [facility] for tasks assessed/performed           Past Medical History:  Diagnosis Date   Anxiety    Neuromuscular disorder (HCC)    L arm, bicep tendon injury, R hamstring injured     Past Surgical History:  Procedure Laterality Date   APPENDECTOMY     2005   DISTAL BICEPS TENDON REPAIR  02/08/2012   Procedure: DISTAL BICEPS TENDON REPAIR;  Surgeon: Cammy Copa, MD;  Location: [facility] OR;  Service: Orthopedics;  Laterality: Left;  Left distal biceps tendon repair   FRACTURE SURGERY     06/2007- L wrist- repair   REPAIR ANKLE LIGAMENT     cleaned out cartilage     There were no vitals filed for this visit.   Subjective Assessment - 11/16/19 0833    Subjective Patient reports the patch helps until about Tuesday then it gets flaired up. Today the pain is about an 8/10. he just feels it a bit in his hip when he is running.    Limitations Other (comment)    Patient Stated Goals Get rid of hip pain and return to running without limitation    Currently in Pain? Yes    Pain Score 8     Pain Location Elbow    Pain Orientation Left    Pain Descriptors / Indicators Aching    Pain Type Chronic pain    Pain Onset More than a month ago    Pain Frequency Constant    Aggravating Factors  running and prolonged walking    Effect of Pain on Daily  Activities pain when using his right arm                             [facility] Adult PT Treatment/Exercise - 11/16/19 0001      Knee/Hip Exercises: Machines for Strengthening   Cybex Leg Press 2x215 60 lbs eccentric down left only down       Knee/Hip Exercises: Standing   Lateral Step Up Limitations x15 6" lateral     Forward Step Up Limitations Step and drive 6" for warm up H96    Step Down Limitations 4 inch eccentric     Other Standing Knee Exercises lateral band walk green 2x10 each direction       Wrist Exercises   Wrist Extension Limitations 3lb x10 eccentricpain after the first set     Other wrist exercises Supination pronation 3lb       Iontophoresis   Type of Iontophoresis Dexamethasone    Location R lateral epicondyle    Dose 4mg /ml    Time 6 hour patch      Manual Therapy   Manual therapy comments Skilled palpation for TPDN and  monitoring pt    Joint Mobilization radial head with supination mobilization     Soft tissue mobilization IASTM along the common extensors     Myofascial Release --            Trigger Point Dry Needling - 11/16/19 0001    Consent Given? Yes    Education Handout Provided Previously provided    Other Dry Needling extensor wod needled and ECRB 2x in extensor wad 1x in the ECRB  25x60     Extensor carpi radialis longus/brevis Response Twitch response elicited;Palpable increased muscle length                PT Education - 11/16/19 0839    Education Details reviewed cotnrol exercises for the hip    Person(s) Educated Patient    Methods Explanation;Demonstration;Verbal cues;Tactile cues            PT Short Term Goals - 10/18/19 1748      PT SHORT TERM GOAL #1   Title independent with initial HEP    Time 4    Period Weeks    Status New    Target Date 11/15/19      PT SHORT TERM GOAL #2   Title -      PT SHORT TERM GOAL #3   Title -             PT Long Term Goals - 10/18/19 1748      PT LONG TERM  GOAL #1   Title pt to report </= 2/10 R elbow pain with liftin and gripping for improvement in condition    Time 8    Period Weeks    Status New    Target Date 12/13/19      PT LONG TERM GOAL #2   Title Patient will exhibit improved hip abduction extensor strength to >/= 5/5 to allow for improved hip control with running    Time 8    Period Weeks    Status New    Target Date 12/13/19      PT LONG TERM GOAL #3   Title Patient will report no limitation with running or stairs due to left hip pain    Time 8    Period Weeks    Status New    Target Date 12/13/19      PT LONG TERM GOAL #4   Title increase FOTO score to </=24% limited to demo improvement in function    Time 8    Period Weeks    Status New    Target Date 12/13/19      PT LONG TERM GOAL #5   Title pt to I with all HEp given to maintain and progress current level of function    Time 8    Period Weeks    Status New    Target Date 12/13/19                 Plan - 11/16/19 0932    Clinical Impression Statement Patient reported some sorensss after treatment in his elbow. He was given an ionto patch. He was advised sometimes ionto can take a few trials to have good carryover. He had fatigue in his gluteals with eccentric loading. Therapy will continue to progress as tolerated    Personal Factors and Comorbidities Time since onset of injury/illness/exacerbation;Past/Current Experience;Comorbidity 1    Comorbidities hx of chronic L hip pain    Examination-Activity Limitations Other;Lift    Examination-Participation Restrictions Community Activity  Stability/Clinical Decision Making Evolving/Moderate complexity    Clinical Decision Making Moderate    Rehab Potential Good    PT Frequency 1x / week    PT Duration 8 weeks    PT Treatment/Interventions ADLs/Self Care Home Management;Cryotherapy;Electrical Stimulation;Moist Heat;Neuromuscular re-education;Balance training;Therapeutic exercise;Therapeutic  activities;Stair training;Gait training;Patient/family education;Manual techniques;Dry needling;Passive range of motion;Taping;Spinal Manipulations;Joint Manipulations;Iontophoresis 4mg /ml Dexamethasone;Traction;Ultrasound    PT Next Visit Plan review/ update HEP PRN,assess response to DN for elbow, trial for piriformis, potential R on R sacral torsion - R inferior sacral mobs and R piriformis strengthening, elbow mobs for lateral epicondaylitis. continue Ionto for lateral epicondyle if continues to respond well.    PT Home Exercise Plan AACAXMEN - standing trunk extension, piriformis stretch (on the L), hip ER on the R only, wrist flexor/ extensor stretch, supination/ pronation    Consulted and Agree with Plan of Care Patient           Patient will benefit from skilled therapeutic intervention in order to improve the following deficits and impairments:  Decreased strength, Pain, Decreased balance, Improper body mechanics, Decreased activity tolerance  Visit Diagnosis: Pain in left hip  Muscle weakness (generalized)  Pain in right elbow  Generalized abdominal pain  Cramp and spasm     Problem List Patient Active Problem List   Diagnosis Date Noted   Right arm pain 09/08/2016   Left Achilles tendinitis 09/21/2012    Carney Living PT DPT  11/16/2019, 9:34 AM  [facility] [address] [place], [place], [address] Phone: [phone]   Fax:  [phone]  Name: Cory Alexander MRN: [id number] Date of Birth: [birth year]

## 2019-11-21 ENCOUNTER — Encounter: Payer: Self-pay | Admitting: Family Medicine

## 2019-11-21 ENCOUNTER — Other Ambulatory Visit: Payer: Self-pay

## 2019-11-21 ENCOUNTER — Ambulatory Visit: Payer: No Typology Code available for payment source | Attending: Internal Medicine

## 2019-11-21 ENCOUNTER — Ambulatory Visit: Payer: No Typology Code available for payment source | Admitting: Family Medicine

## 2019-11-21 VITALS — BP 130/78 | Ht 72.0 in | Wt 205.0 lb

## 2019-11-21 DIAGNOSIS — M7711 Lateral epicondylitis, right elbow: Secondary | ICD-10-CM | POA: Diagnosis not present

## 2019-11-21 DIAGNOSIS — Z20822 Contact with and (suspected) exposure to covid-19: Secondary | ICD-10-CM

## 2019-11-21 MED ORDER — METHYLPREDNISOLONE ACETATE 40 MG/ML IJ SUSP
40.0000 mg | Freq: Once | INTRAMUSCULAR | Status: AC
  Administered 2019-11-21: 40 mg via INTRA_ARTICULAR

## 2019-11-21 NOTE — Addendum Note (Signed)
Addended by: Annita Brod on: 11/21/2019 01:49 PM   Modules accepted: Orders

## 2019-11-21 NOTE — Patient Instructions (Signed)
You have lateral epicondylitis Try to avoid painful activities as much as possible. Ice the area 3-4 times a day for 15 minutes at a time. Tylenol or aleve as needed for pain. Counterforce brace as directed can help unload area - wear this regularly if it provides you with relief. Hold the exercises for now - ok to restart a week from now. Call me in 1 month to 6 weeks (or as needed) if you're not getting better - next step would be referral to the surgeon.

## 2019-11-21 NOTE — Progress Notes (Signed)
PCP: Lujean Amel, MD  Subjective:   HPI: Patient is a 44 y.o. male here for left hip, right achilles, right elbow pain.  3/3: Cory Alexander is here to follow-up on left hip pain.  He has been going to physical therapy and noticed improvement in his anterior hip, but little to no improvement in the posterior hip.  He is able to run, but after the run will have some pain.  He has moderate pain with just walking.  He is requiring gabapentin nightly to get good sleep.  He has tried nitro patches in the past for an Achilles injury, but did have moderate headaches.  He has not tried the diclofenac.  Denies any radiating symptoms into his foot.  Pain is still sharp and localized to the posterior lateral hip.  4/21: Patient reports his left hip feels about the same - posterior pain. Has only done 1 visit of PT since last visit as he injured his right achilles about 3 weeks ago playing pickle ball Associated swelling and soreness. No bruising.   Difficulty doing a calf raise. Tried running 2 miles and had a lot of difficulty due to pain. He's wearing a heel lift on left side now as PT noted a small leg length discrepancy.  5/24: Patient reports his right achilles and left hip are improving. His main complaint is worsened lateral right elbow pain. Noted after playing pickleball. Has done stretching and needling once at PT. No numbness. No acute injury.  6/16: Patient reports elbow has worsened since last visit. Pain up to 10/10 lateral elbow. Has been doing home exercises after having done PT. Pain worse with pouring water, using screwdriver.  Past Medical History:  Diagnosis Date  . Anxiety   . Neuromuscular disorder (HCC)    L arm, bicep tendon injury, R hamstring injured     Current Outpatient Medications on File Prior to Visit  Medication Sig Dispense Refill  . CIALIS 20 MG tablet     . diclofenac (VOLTAREN) 75 MG EC tablet Take 1 tablet (75 mg total) by mouth 2 (two) times daily. 60  tablet 1  . gabapentin (NEURONTIN) 300 MG capsule Take 1 capsule (300 mg total) by mouth at bedtime. 30 capsule 2  . PARoxetine (PAXIL) 10 MG tablet      No current facility-administered medications on file prior to visit.    Past Surgical History:  Procedure Laterality Date  . APPENDECTOMY     2005  . DISTAL BICEPS TENDON REPAIR  02/08/2012   Procedure: DISTAL BICEPS TENDON REPAIR;  Surgeon: Meredith Pel, MD;  Location: [facility];  Service: Orthopedics;  Laterality: Left;  Left distal biceps tendon repair  . FRACTURE SURGERY     06/2007- L wrist- repair  . REPAIR ANKLE LIGAMENT     cleaned out cartilage     No Known Allergies  Social History   Socioeconomic History  . Marital status: Married    Spouse name: Not on file  . Number of children: Not on file  . Years of education: Not on file  . Highest education level: Not on file  Occupational History  . Not on file  Tobacco Use  . Smoking status: Never Smoker  . Smokeless tobacco: Never Used  Substance and Sexual Activity  . Alcohol use: Yes    Comment: social   . Drug use: No  . Sexual activity: Not on file  Other Topics Concern  . Not on file  Social History Narrative  .  Not on file   Social Determinants of Health   Financial Resource Strain:   . Difficulty of Paying Living Expenses:   Food Insecurity:   . Worried About Programme researcher, broadcasting/film/video in the Last Year:   . Barista in the Last Year:   Transportation Needs:   . Freight forwarder (Medical):   Marland Kitchen Lack of Transportation (Non-Medical):   Physical Activity:   . Days of Exercise per Week:   . Minutes of Exercise per Session:   Stress:   . Feeling of Stress :   Social Connections:   . Frequency of Communication with Friends and Family:   . Frequency of Social Gatherings with Friends and Family:   . Attends Religious Services:   . Active Member of Clubs or Organizations:   . Attends Banker Meetings:   Marland Kitchen Marital Status:   Intimate  Partner Violence:   . Fear of Current or Ex-Partner:   . Emotionally Abused:   Marland Kitchen Physically Abused:   . Sexually Abused:     Family History  Problem Relation Age of Onset  . Heart attack Father   . Diabetes Neg Hx   . Hyperlipidemia Neg Hx   . Hypertension Neg Hx   . Sudden death Neg Hx     BP 130/78   Ht 6' (1.829 m)   Wt 205 lb (93 kg)   BMI 27.80 kg/m   Review of Systems: See HPI above.     Objective:  Physical Exam:  Gen: NAD, comfortable in exam room  Right elbow: No deformity. FROM with 5/5 strength.  Pain with resisted wrist extension, 3rd digit extension. Tenderness to palpation lateral epicondyle.  No other tenderness. NVI distally. Collateral ligaments intact.  Assessment & Plan:  1. Right lateral epicondylitis - Continues to have significant pain lateral elbow, up to 10/10 level at this time.  Doing therapy and home exercises.  Has worn counterforce brace also.  Discussed options - given level of pain, lack of response to conservative therapy so far went ahead with steroid injection today under ultrasound guidance.  Wait a week before restarting exercises/therapy.  Consider nitro patches, PRP, surgical referral if not improving.  After informed written consent patient timeout was performed and patient was lying supine on exam table.  Area just distal to right lateral epicondyle prepped with alcohol swab then utilizing ultrasound guidance, patients right common extensor tendon was injected just overlying this with 2:1 bupivicaine: depomedrol.  Patient tolerated the procedure well without immediate complications.

## 2019-11-22 LAB — SARS-COV-2, NAA 2 DAY TAT

## 2019-11-22 LAB — NOVEL CORONAVIRUS, NAA: SARS-CoV-2, NAA: NOT DETECTED

## 2019-11-23 ENCOUNTER — Ambulatory Visit: Payer: No Typology Code available for payment source | Admitting: Physical Therapy

## 2019-12-05 ENCOUNTER — Ambulatory Visit: Payer: No Typology Code available for payment source | Admitting: Physical Therapy

## 2019-12-05 ENCOUNTER — Other Ambulatory Visit: Payer: Self-pay

## 2019-12-05 ENCOUNTER — Encounter: Payer: Self-pay | Admitting: Physical Therapy

## 2019-12-05 DIAGNOSIS — R252 Cramp and spasm: Secondary | ICD-10-CM

## 2019-12-05 DIAGNOSIS — M25552 Pain in left hip: Secondary | ICD-10-CM

## 2019-12-05 DIAGNOSIS — M25521 Pain in right elbow: Secondary | ICD-10-CM

## 2019-12-05 DIAGNOSIS — M6281 Muscle weakness (generalized): Secondary | ICD-10-CM

## 2019-12-05 DIAGNOSIS — R1084 Generalized abdominal pain: Secondary | ICD-10-CM

## 2019-12-05 NOTE — Therapy (Addendum)
[name], [place], [address] Phone: [phone]   Fax:  [phone]  Physical Therapy Treatment / Discharge  Patient Details  Name: Cory Alexander MRN: [id number] Date of Birth: 02-Mar-1976 Referring Provider (PT): Dene Gentry, MD   Encounter Date: 12/05/2019   PT End of Session - 12/05/19 1146    Visit Number 5    Number of Visits 13    Date for PT Re-Evaluation 12/13/19    Authorization Type [employer]    PT Start Time 2094    PT Stop Time 1228    PT Time Calculation (min) 43 min    Activity Tolerance Patient tolerated treatment well    Behavior During Therapy [facility] for tasks assessed/performed           Past Medical History:  Diagnosis Date  . Anxiety   . Neuromuscular disorder (HCC)    L arm, bicep tendon injury, R hamstring injured     Past Surgical History:  Procedure Laterality Date  . APPENDECTOMY     2005  . DISTAL BICEPS TENDON REPAIR  02/08/2012   Procedure: DISTAL BICEPS TENDON REPAIR;  Surgeon: Meredith Pel, MD;  Location: [facility];  Service: Orthopedics;  Laterality: Left;  Left distal biceps tendon repair  . FRACTURE SURGERY     06/2007- L wrist- repair  . REPAIR ANKLE LIGAMENT     cleaned out cartilage     There were no vitals filed for this visit.   Subjective Assessment - 12/05/19 1146    Subjective "I was still having the elbow and saw [name] on the 6/16 and he gave a shot and it is doing so much better, it is so much better today at 1/10.  The last session I was here I was on the leg press and it killed my back. I was in [place] and I did yoga which I think helped andmade me think I need to work on core/ back strength."    Currently in Pain? Yes    Pain Score 1     Pain Location Hip    Pain Orientation Left    Pain Descriptors / Indicators Dull;Aching    Pain Type Chronic pain    Pain Onset More than a month ago    Pain Frequency Intermittent    Aggravating  Factors  running and prlonged walking    Pain Score 1    Pain Location Elbow    Pain Orientation Right    Pain Descriptors / Indicators Dull    Pain Type Chronic pain              [facility] PT Assessment - 12/05/19 0001      Assessment   Medical Diagnosis Left hip pain, R elbow pain    Referring Provider (PT) Dene Gentry, MD                         [facility] Adult PT Treatment/Exercise - 12/05/19 0001      Lumbar Exercises: Supine   Dead Bug --   2 x 20 seconds static hold   Dead Bug Limitations 2 x 10 alternating UE/LE with ball       Lumbar Exercises: Quadruped   Opposite Arm/Leg Raise Right arm/Left leg;Left arm/Right leg;10 reps   5 times adding lateral moement with UE/LE     Knee/Hip Exercises: Stretches   Hip Flexor Stretch 2 reps;30 seconds;Both  Knee/Hip Exercises: Seated   Other Seated Knee/Hip Exercises seated oblique twist 1 x 10      Knee/Hip Exercises: Prone   Other Prone Exercises tall plank 2 x 20 sec. hip taps 2 x 8, alternaign UE shoulder taps 1 x 10      Manual Therapy   Manual Therapy Taping    Manual therapy comments MTPR along bil lumbar parapsinals    Soft tissue mobilization IASTM along bil lumbar paraspinals    Kinesiotex Inhibit Muscle      Kinesiotix   Inhibit Muscle  bil paraspinals                  PT Education - 12/05/19 1231    Education Details reviewed HEP and updated today for core strengthening.    Person(s) Educated Patient    Methods Explanation;Verbal cues;Handout    Comprehension Verbalized understanding;Verbal cues required            PT Short Term Goals - 10/18/19 1748      PT SHORT TERM GOAL #1   Title independent with initial HEP    Time 4    Period Weeks    Status New    Target Date 11/15/19      PT SHORT TERM GOAL #2   Title -      PT SHORT TERM GOAL #3   Title -             PT Long Term Goals - 10/18/19 1748      PT LONG TERM GOAL #1   Title pt to report </= 2/10 R  elbow pain with liftin and gripping for improvement in condition    Time 8    Period Weeks    Status New    Target Date 12/13/19      PT LONG TERM GOAL #2   Title Patient will exhibit improved hip abduction extensor strength to >/= 5/5 to allow for improved hip control with running    Time 8    Period Weeks    Status New    Target Date 12/13/19      PT LONG TERM GOAL #3   Title Patient will report no limitation with running or stairs due to left hip pain    Time 8    Period Weeks    Status New    Target Date 12/13/19      PT LONG TERM GOAL #4   Title increase FOTO score to </=24% limited to demo improvement in function    Time 8    Period Weeks    Status New    Target Date 12/13/19      PT LONG TERM GOAL #5   Title pt to I with all HEp given to maintain and progress current level of function    Time 8    Period Weeks    Status New    Target Date 12/13/19                 Plan - 12/05/19 1210    Clinical Impression Statement Mars reports significant relief of elbow pain after getting an injection inthe R elbow on 11/21/2019. Focused session STW along bil paraspinals and trialed KT tape to reduce muscle tension. utilzied remainder of session to work on core strength but to avoid over activation of hip flexors. He di dwell with exercises but did fatigue somewhat quickly.    PT Next Visit Plan ERO, review/ update HEP PRN,assess response to  DN for elbow, trial for piriformis, potential R on R sacral torsion - R inferior sacral mobs and R piriformis strengthening, elbow mobs for lateral epicondaylitis. continue Ionto for lateral epicondyle if continues to respond well.    PT Home Exercise Plan AACAXMEN - standing trunk extension, piriformis stretch (on the L), hip ER on the R only, wrist flexor/ extensor stretch, supination/ pronation, tall plank, bird dog, dead bug    Consulted and Agree with Plan of Care Patient           Patient will benefit from skilled therapeutic  intervention in order to improve the following deficits and impairments:  Decreased strength, Pain, Decreased balance, Improper body mechanics, Decreased activity tolerance  Visit Diagnosis: Pain in left hip  Muscle weakness (generalized)  Pain in right elbow  Generalized abdominal pain  Cramp and spasm     Problem List Patient Active Problem List   Diagnosis Date Noted  . Right arm pain 09/08/2016  . Left Achilles tendinitis 09/21/2012   Starr Lake PT, DPT, LAT, ATC  12/05/19  12:37 PM      [facility] [address] [place], [place], [address] Phone: [phone]   Fax:  [phone]  Name: Chandler Swiderski MRN: [id number] Date of Birth: Oct 22, 1975     PHYSICAL THERAPY DISCHARGE SUMMARY  Visits from Start of Care: 5  Current functional level related to goals / functional outcomes: See goals   Remaining deficits: Current status unknown   Education / Equipment: HEP  Plan: Patient agrees to discharge.  Patient goals were partially met. Patient is being discharged due to not returning since the last visit.  ?????        Braedyn Kauk PT, DPT, LAT, ATC  02/06/20  2:38 PM

## 2019-12-12 ENCOUNTER — Ambulatory Visit: Payer: 59 | Admitting: Family Medicine

## 2019-12-31 ENCOUNTER — Ambulatory Visit: Payer: No Typology Code available for payment source | Attending: Internal Medicine

## 2019-12-31 DIAGNOSIS — Z20822 Contact with and (suspected) exposure to covid-19: Secondary | ICD-10-CM

## 2020-01-01 LAB — NOVEL CORONAVIRUS, NAA: SARS-CoV-2, NAA: NOT DETECTED

## 2020-01-01 LAB — SARS-COV-2, NAA 2 DAY TAT

## 2020-05-15 ENCOUNTER — Other Ambulatory Visit: Payer: Self-pay | Admitting: Family Medicine

## 2020-06-30 ENCOUNTER — Encounter: Payer: Self-pay | Admitting: Family Medicine

## 2020-06-30 ENCOUNTER — Other Ambulatory Visit: Payer: Self-pay

## 2020-06-30 ENCOUNTER — Ambulatory Visit (INDEPENDENT_AMBULATORY_CARE_PROVIDER_SITE_OTHER): Payer: Managed Care, Other (non HMO) | Admitting: Family Medicine

## 2020-06-30 VITALS — BP 126/80 | Ht 72.0 in | Wt 205.0 lb

## 2020-06-30 DIAGNOSIS — M7711 Lateral epicondylitis, right elbow: Secondary | ICD-10-CM | POA: Diagnosis not present

## 2020-06-30 DIAGNOSIS — M7662 Achilles tendinitis, left leg: Secondary | ICD-10-CM | POA: Diagnosis not present

## 2020-06-30 NOTE — Patient Instructions (Signed)
You have Achilles Tendinopathy Voltaren gel up to 4 times a day topically. Use heel lifts in shoes - you can switch these in and out of different pairs. Wait about 1-2 weeks before starting the rehab exercises. Cycling, swimming for exercise - you'll likely need to be a week at least into the rehab with minimal pain (1-2/10 level) before returning to running and try to do so on a flat surface first like a treadmill or track. Ice 15 minutes at a time 3-4 times a day. Avoid uneven ground, hills as much as possible. Consider physical therapy, orthotics, nitro patches if not improving as expected. Follow up in 4 weeks.

## 2020-06-30 NOTE — Progress Notes (Signed)
    SUBJECTIVE:   CHIEF COMPLAINT / HPI: F/u elbow and Achilles  Cory Alexander is a 45 year old gentleman presenting for follow-up of his right elbow and left Achilles:  Right lateral epicondylitis: Refractory.  He has previously been seen by Dr. Pearletha Forge several times over the past year due to this concern. Last seen in 11/2019, at that time they tried injection after limited benefit from conservative therapy including nitro patches, bracing, rehab exercises.  He reports that he had full resolution of his symptoms after injection until about 05/2020 when it slowly started to come back.  Reports his pain is now more localized to the elbow only and does not radiate down as it previously has.  Less severe than previous, but achy and bothersome.  Worse with gripping.  Has not tried much for it at this time.  PT/exercises seem to make it worse last time.  Left Achilles: Pain since about mid 05/2020 while running a half marathon at that time.  Denies any significant pop or tear noise, however started to have excruciating pain, now just a general ache and tender to palpation.  Since this, he has noticed a knot in his tendon that was not previously there.  He has tried to run a few times since then, but has not been able to tolerate well.  He has been icing with general rest.  Denies any weakness, numbness/tingling, or difficulty with gait.  PERTINENT  PMH / PSH: Previous left Achilles tendinitis in 2014  OBJECTIVE:   BP 126/80   Ht 6' (1.829 m)   Wt 205 lb (93 kg)   BMI 27.80 kg/m   General: Alert, NAD HEENT: NCAT, MMM Lungs: No increased WOB   R elbow:  - Inspection: no obvious deformity. No swelling, erythema or bruising - Palpation: TTP lateral epicondyle - ROM: full active ROM in flexion and extension.  - Strength: 5/5 strength in wrist flexion and extension. 5/5 strength in biceps, triceps.  Pain with resisted wrist extension and hand grip. - Neuro: NV intact distally  L achilles:  Inspection:  No erythema or bruising, can see thickening and compared to right. Palpation: TTP over left Achilles tendon with nodule approximately 2 cm superior to insertion site on calcaneus. ROM: Full plantar flexion/dorsiflexion Strength: 5/5 strength of bilateral ankles, difficulty with single leg calf raises Special testing: Negative Thompson test  Limited MSK U/S:  Left Achilles tendon intact without tear seen.  Achilles tendon thickness approximately 0.8 cm at area of knot without hypervascularity.  Right common extensor tendon with hypoechoic area at insertion without neovascularity.  ASSESSMENT/PLAN:   Left Achilles tendinopathy: Acute. Reassuringly no evidence of tear on U/S or through exam.  Recommended ice, Voltaren gel, and using heel lifts in his shoes.  Placed heel lifts in his shoes during appointment today.  Encourage crosstraining to provide rest (cycling, swimming) rather than running and given home rehab exercises to start in the next 1-2 weeks.  Recommended follow-up in approximately 4 weeks, can consider formal PT, nitro patches, orthotics at that time if not improving.  Right lateral epicondylitis: Acute, recurrent. Recurrence recently with mild symptoms after full resolution following injection in 11/2019.  Recommended relative rest and can wear his brace as needed.  Can consider injection one additional time in the future if not improving.   Follow-up in 4 weeks or sooner if needed.  Allayne Stack, DO [facility]

## 2020-07-30 ENCOUNTER — Encounter: Payer: Self-pay | Admitting: Family Medicine

## 2020-07-30 ENCOUNTER — Other Ambulatory Visit: Payer: Self-pay

## 2020-07-30 ENCOUNTER — Ambulatory Visit (INDEPENDENT_AMBULATORY_CARE_PROVIDER_SITE_OTHER): Payer: Managed Care, Other (non HMO) | Admitting: Family Medicine

## 2020-07-30 VITALS — BP 110/68 | Ht 72.0 in | Wt 205.0 lb

## 2020-07-30 DIAGNOSIS — M7711 Lateral epicondylitis, right elbow: Secondary | ICD-10-CM | POA: Diagnosis not present

## 2020-07-30 DIAGNOSIS — M7662 Achilles tendinitis, left leg: Secondary | ICD-10-CM

## 2020-07-30 NOTE — Patient Instructions (Signed)
Continue your home exercises and the voltaren gel. Continue building up your running volume ideally on level surfaces and not running on back to back days yet (for about 2 weeks). Icing as needed. Follow up with me in 6 weeks or as needed.

## 2020-07-30 NOTE — Progress Notes (Signed)
PCP: Darrow Bussing, MD  Subjective:   HPI: Patient is a 45 y.o. male here for left achilles, right elbow pain.  1/24: Right lateral epicondylitis: Refractory.  He has previously been seen by Dr. Pearletha Forge several times over the past year due to this concern. Last seen in 11/2019, at that time they tried injection after limited benefit from conservative therapy including nitro patches, bracing, rehab exercises.  He reports that he had full resolution of his symptoms after injection until about 05/2020 when it slowly started to come back.  Reports his pain is now more localized to the elbow only and does not radiate down as it previously has.  Less severe than previous, but achy and bothersome.  Worse with gripping.  Has not tried much for it at this time.  PT/exercises seem to make it worse last time.  Left Achilles: Pain since about mid 05/2020 while running a half marathon at that time.  Denies any significant pop or tear noise, however started to have excruciating pain, now just a general ache and tender to palpation.  Since this, he has noticed a knot in his tendon that was not previously there.  He has tried to run a few times since then, but has not been able to tolerate well.  He has been icing with general rest.  Denies any weakness, numbness/tingling, or difficulty with gait.  2/23: Patient reports mild improvement since last visit in both areas of pain. Using voltaren gel, doing home exercises with eccentrics for achilles. Has been able to run up to 2-3 miles at a time without increase in pain.   Past Medical History:  Diagnosis Date  . Anxiety   . Neuromuscular disorder (HCC)    L arm, bicep tendon injury, R hamstring injured     Current Outpatient Medications on File Prior to Visit  Medication Sig Dispense Refill  . CIALIS 20 MG tablet     . gabapentin (NEURONTIN) 300 MG capsule Take 1 capsule (300 mg total) by mouth at bedtime. 30 capsule 1  . PARoxetine (PAXIL) 10 MG tablet       No current facility-administered medications on file prior to visit.    Past Surgical History:  Procedure Laterality Date  . APPENDECTOMY     2005  . DISTAL BICEPS TENDON REPAIR  02/08/2012   Procedure: DISTAL BICEPS TENDON REPAIR;  Surgeon: Cammy Copa, MD;  Location: [facility] OR;  Service: Orthopedics;  Laterality: Left;  Left distal biceps tendon repair  . FRACTURE SURGERY     06/2007- L wrist- repair  . REPAIR ANKLE LIGAMENT     cleaned out cartilage     No Known Allergies  Social History   Socioeconomic History  . Marital status: Married    Spouse name: Not on file  . Number of children: Not on file  . Years of education: Not on file  . Highest education level: Not on file  Occupational History  . Not on file  Tobacco Use  . Smoking status: Never Smoker  . Smokeless tobacco: Never Used  Substance and Sexual Activity  . Alcohol use: Yes    Comment: social   . Drug use: No  . Sexual activity: Not on file  Other Topics Concern  . Not on file  Social History Narrative  . Not on file   Social Determinants of Health   Financial Resource Strain: Not on file  Food Insecurity: Not on file  Transportation Needs: Not on file  Physical Activity:  Not on file  Stress: Not on file  Social Connections: Not on file  Intimate Partner Violence: Not on file    Family History  Problem Relation Age of Onset  . Heart attack Father   . Diabetes Neg Hx   . Hyperlipidemia Neg Hx   . Hypertension Neg Hx   . Sudden death Neg Hx     BP 110/68   Ht 6' (1.829 m)   Wt 205 lb (93 kg)   BMI 27.80 kg/m   No flowsheet data found.  No flowsheet data found.  Review of Systems: See HPI above.     Objective:  Physical Exam:  Gen: NAD, comfortable in exam room  Right elbow: No deformity. FROM with 5/5 strength with mild pain wrist extension. Tenderness to palpation lateral epicondyle. NVI distally.   Left foot/ankle: Nodule about 2-3 cm proximal to achilles  insertion on calcaneus.  No other gross deformity, swelling, ecchymoses FROM No TTP. Negative ant drawer and talar tilt.   Negative calcaneal squeeze. Thompsons test negative. NV intact distally.  Assessment & Plan:  1. Right lateral epicondylitis - clinically improving.  Continue voltaren gel, home exercise program.  Can consider repeat injection if not improving.  2. Left achilles tendinopathy - continue eccentrics, voltaren gel.  Icing.  Activities as tolerated.  Heel lifts.  F/u in 6 weeks or prn.

## 2020-12-02 ENCOUNTER — Encounter (HOSPITAL_COMMUNITY): Payer: Self-pay | Admitting: Emergency Medicine

## 2020-12-02 ENCOUNTER — Emergency Department (HOSPITAL_COMMUNITY): Payer: Managed Care, Other (non HMO)

## 2020-12-02 ENCOUNTER — Emergency Department (HOSPITAL_COMMUNITY)
Admission: EM | Admit: 2020-12-02 | Discharge: 2020-12-02 | Disposition: A | Discharge status: Home / Self Care | Payer: Managed Care, Other (non HMO) | Attending: Emergency Medicine | Admitting: Emergency Medicine

## 2020-12-02 DIAGNOSIS — Z8616 Personal history of COVID-19: Secondary | ICD-10-CM | POA: Insufficient documentation

## 2020-12-02 DIAGNOSIS — Y900 Blood alcohol level of less than 20 mg/100 ml: Secondary | ICD-10-CM | POA: Insufficient documentation

## 2020-12-02 DIAGNOSIS — R001 Bradycardia, unspecified: Secondary | ICD-10-CM | POA: Insufficient documentation

## 2020-12-02 DIAGNOSIS — R569 Unspecified convulsions: Secondary | ICD-10-CM | POA: Diagnosis not present

## 2020-12-02 DIAGNOSIS — R55 Syncope and collapse: Secondary | ICD-10-CM | POA: Diagnosis present

## 2020-12-02 LAB — CBC WITH DIFFERENTIAL/PLATELET
Abs Immature Granulocytes: 0.02 10*3/uL (ref 0.00–0.07)
Basophils Absolute: 0 10*3/uL (ref 0.0–0.1)
Basophils Relative: 1 %
Eosinophils Absolute: 0.1 10*3/uL (ref 0.0–0.5)
Eosinophils Relative: 2 %
HCT: 39 % (ref 39.0–52.0)
Hemoglobin: 13.8 g/dL (ref 13.0–17.0)
Immature Granulocytes: 0 %
Lymphocytes Relative: 26 %
Lymphs Abs: 1.4 10*3/uL (ref 0.7–4.0)
MCH: 32.1 pg (ref 26.0–34.0)
MCHC: 35.4 g/dL (ref 30.0–36.0)
MCV: 90.7 fL (ref 80.0–100.0)
Monocytes Absolute: 0.5 10*3/uL (ref 0.1–1.0)
Monocytes Relative: 9 %
Neutro Abs: 3.4 10*3/uL (ref 1.7–7.7)
Neutrophils Relative %: 62 %
Platelets: 243 10*3/uL (ref 150–400)
RBC: 4.3 MIL/uL (ref 4.22–5.81)
RDW: 12 % (ref 11.5–15.5)
WBC: 5.5 10*3/uL (ref 4.0–10.5)
nRBC: 0 % (ref 0.0–0.2)

## 2020-12-02 LAB — COMPREHENSIVE METABOLIC PANEL
ALT: 17 U/L (ref 0–44)
AST: 24 U/L (ref 15–41)
Albumin: 3.9 g/dL (ref 3.5–5.0)
Alkaline Phosphatase: 50 U/L (ref 38–126)
Anion gap: 7 (ref 5–15)
BUN: 12 mg/dL (ref 6–20)
CO2: 27 mmol/L (ref 22–32)
Calcium: 9.1 mg/dL (ref 8.9–10.3)
Chloride: 107 mmol/L (ref 98–111)
Creatinine, Ser: 0.99 mg/dL (ref 0.61–1.24)
GFR, Estimated: >60 mL/min (ref >60)
Glucose, Bld: 121 mg/dL — ABNORMAL HIGH (ref 70–99)
Potassium: 4 mmol/L (ref 3.5–5.1)
Sodium: 141 mmol/L (ref 135–145)
Total Bilirubin: 0.6 mg/dL (ref 0.3–1.2)
Total Protein: 6.3 g/dL — ABNORMAL LOW (ref 6.5–8.1)

## 2020-12-02 LAB — ETHANOL: Alcohol, Ethyl (B): <10 mg/dL (ref <10)

## 2020-12-02 MED ORDER — MECLIZINE HCL 25 MG PO TABS
25.0000 mg | ORAL_TABLET | Freq: Once | ORAL | Status: AC
  Administered 2020-12-02: 25 mg via ORAL
  Filled 2020-12-02: qty 1

## 2020-12-02 MED ORDER — MECLIZINE HCL 25 MG PO TABS: 25.0000 mg | ORAL_TABLET | Freq: Three times a day (TID) | ORAL | 0 refills | Status: AC | PRN

## 2020-12-02 MED ORDER — ACETAMINOPHEN 325 MG PO TABS
650.0000 mg | ORAL_TABLET | Freq: Once | ORAL | Status: AC
  Administered 2020-12-02: 650 mg via ORAL
  Filled 2020-12-02: qty 2

## 2020-12-02 MED ORDER — IBUPROFEN 400 MG PO TABS
600.0000 mg | ORAL_TABLET | Freq: Once | ORAL | Status: AC
  Administered 2020-12-02: 600 mg via ORAL
  Filled 2020-12-02: qty 1

## 2020-12-02 NOTE — Discharge Instructions (Addendum)
Take the meclizine as needed to help with your vertigo. Follow-up with your primary care provider. Return to the ER if you start to experience additional episodes of syncope, increased dizziness, severe headache, blurry vision, numbness or chest pain.

## 2020-12-02 NOTE — ED Notes (Signed)
Patient ambulated to restroom with steady gait and stand by assist. Reported feeling "foggy" in the head.

## 2020-12-02 NOTE — ED Triage Notes (Signed)
Patient brought in by EMS from his PCP after having a syncopal episode and then had a seizure. States he was there for a routine visit and had a vagal response. Was incontinent. No meds given PTA.

## 2020-12-02 NOTE — ED Provider Notes (Signed)
[facility] EMERGENCY DEPARTMENT Provider Note   CSN: [id number] Arrival date & time: 12/02/20  1253     History No chief complaint on file.   Cory Alexander is a 45 y.o. male with a past medical history of anxiety presenting to the ED with a chief complaint of syncopal episode.  States that he was at his PCPs office this morning.  He was getting blood work drawn when he began feeling lightheaded.  States that "next thing I knew I was on the ground with my feet up, with everyone surrounding me."  He was told that he had a seizure-like episode that lasted for unknown amount of time.  He denies history of seizures in the past.  He states that he has not eaten anything today as he needed fasting labs drawn.  Currently he is having a headache on the right side of his head and posteriorly which she believes is due to a muscle strain.  States that his head did not strike the floor as he was sitting down in a chair when this happened.  He denies any daily alcohol use, drug use, recent medication changes, vision changes, numbness in arms or legs, chest pain or shortness of breath.  Patient states that his heart rate typically is on the lower side but his PCP told him today that it was a little lower than usual.  Admits that work is more stressful than usual lately.  Had COVID at the beginning of March 2022 and has noticed feelings of being "off balance" when he lays down since this diagnosis.  He has vertigo at baseline and this is unchanged.  Denies any tongue trauma.  HPI     Past Medical History:  Diagnosis Date   Anxiety    Neuromuscular disorder (HCC)    L arm, bicep tendon injury, R hamstring injured     Patient Active Problem List   Diagnosis Date Noted   Right arm pain 09/08/2016   Left Achilles tendinitis 09/21/2012    Past Surgical History:  Procedure Laterality Date   APPENDECTOMY     2005   DISTAL BICEPS TENDON REPAIR  02/08/2012   Procedure: DISTAL BICEPS  TENDON REPAIR;  Surgeon: Cammy Copa, MD;  Location: [facility] OR;  Service: Orthopedics;  Laterality: Left;  Left distal biceps tendon repair   FRACTURE SURGERY     06/2007- L wrist- repair   REPAIR ANKLE LIGAMENT     cleaned out cartilage        Family History  Problem Relation Age of Onset   Heart attack Father    Diabetes Neg Hx    Hyperlipidemia Neg Hx    Hypertension Neg Hx    Sudden death Neg Hx     Social History   Tobacco Use   Smoking status: Never   Smokeless tobacco: Never  Substance Use Topics   Alcohol use: Yes    Comment: social    Drug use: No    Home Medications Prior to Admission medications   Medication Sig Start Date End Date Taking? Authorizing Provider  meclizine (ANTIVERT) 25 MG tablet Take 1 tablet (25 mg total) by mouth 3 (three) times daily as needed for dizziness. 12/02/20  Yes Caitlyne Ingham, PA-C  CIALIS 20 MG tablet  07/12/16   [provider]  gabapentin (NEURONTIN) 300 MG capsule Take 1 capsule (300 mg total) by mouth at bedtime. 05/16/20   [name], Azucena Fallen, MD  PARoxetine (PAXIL) 10 MG tablet  08/21/16   [provider]    Allergies    Patient has no known allergies.  Review of Systems   Review of Systems  Constitutional:  Negative for appetite change, chills and fever.  HENT:  Negative for ear pain, rhinorrhea, sneezing and sore throat.   Eyes:  Negative for photophobia and visual disturbance.  Respiratory:  Negative for cough, chest tightness, shortness of breath and wheezing.   Cardiovascular:  Negative for chest pain and palpitations.  Gastrointestinal:  Negative for abdominal pain, blood in stool, constipation, diarrhea, nausea and vomiting.  Genitourinary:  Negative for dysuria, hematuria and urgency.  Musculoskeletal:  Negative for myalgias.  Skin:  Negative for rash.  Neurological:  Positive for seizures, syncope and headaches. Negative for dizziness, weakness and light-headedness.  Denie  Physical Exam Updated  Vital Signs BP 125/81   Pulse (!) 57   Temp 97.6 F (36.4 C)   Resp 15   Ht 6' (1.829 m)   Wt 93 kg   SpO2 100%   BMI 27.80 kg/m   Physical Exam Vitals and nursing note reviewed.  Constitutional:      General: He is not in acute distress.    Appearance: He is well-developed.  HENT:     Head: Normocephalic and atraumatic.     Nose: Nose normal.  Eyes:     General: No scleral icterus.       Left eye: No discharge.     Conjunctiva/sclera: Conjunctivae normal.  Cardiovascular:     Rate and Rhythm: Normal rate and regular rhythm.     Heart sounds: Normal heart sounds. No murmur heard.   No friction rub. No gallop.  Pulmonary:     Effort: Pulmonary effort is normal. No respiratory distress.     Breath sounds: Normal breath sounds.  Abdominal:     General: Bowel sounds are normal. There is no distension.     Palpations: Abdomen is soft.     Tenderness: There is no abdominal tenderness. There is no guarding.  Musculoskeletal:        General: Normal range of motion.     Cervical back: Normal range of motion and neck supple.  Skin:    General: Skin is warm and dry.     Findings: No rash.  Neurological:     Mental Status: He is alert and oriented to person, place, and time.     Cranial Nerves: No cranial nerve deficit.     Sensory: No sensory deficit.     Motor: No weakness or abnormal muscle tone.     Coordination: Coordination normal.     Comments: Pupils reactive. No facial asymmetry noted. Cranial nerves appear grossly intact. Sensation intact to light touch on face, BUE and BLE. Strength 5/5 in BUE and BLE. Normal coordination.    ED Results / Procedures / Treatments   Labs (all labs ordered are listed, but only abnormal results are displayed) Labs Reviewed  COMPREHENSIVE METABOLIC PANEL - Abnormal; Notable for the following components:      Result Value   Glucose, Bld 121 (*)    Total Protein 6.3 (*)    All other components within normal limits  CBC WITH  DIFFERENTIAL/PLATELET  ETHANOL  CBG MONITORING, ED    EKG EKG Interpretation  Date/Time:  Tuesday December 02 2020 13:00:47 EDT Ventricular Rate:  45 PR Interval:  194 QRS Duration: 101 QT Interval:  451 QTC Calculation: 391 R Axis:   81 Text Interpretation: Sinus bradycardia ST elev,  probable normal early repol pattern No significant change since last tracing Confirmed by Gwyneth Sprout ([id number]) on 12/02/2020 1:43:19 PM  Radiology CT HEAD WO CONTRAST  Result Date: 12/02/2020 CLINICAL DATA:  Seizure, nontraumatic. EXAM: CT HEAD WITHOUT CONTRAST TECHNIQUE: Contiguous axial images were obtained from the base of the skull through the vertex without intravenous contrast. COMPARISON:  Head CT August 12, 2010 FINDINGS: Brain: No evidence of acute infarction, hemorrhage, hydrocephalus, extra-axial collection or mass lesion/mass effect. Vascular: No hyperdense vessel or unexpected calcification. Skull: Normal. Negative for fracture or focal lesion. Sinuses/Orbits: Mucosal thickening of the bilateral maxillary sinuses. Other: None. IMPRESSION: No acute intracranial abnormality. Electronically Signed   By: Baldemar Lenis M.D.   On: 12/02/2020 15:05    Procedures Procedures   Medications Ordered in ED Medications  acetaminophen (TYLENOL) tablet 650 mg (650 mg Oral Given 12/02/20 1720)  ibuprofen (ADVIL) tablet 600 mg (600 mg Oral Given 12/02/20 1719)  meclizine (ANTIVERT) tablet 25 mg (25 mg Oral Given 12/02/20 1720)    ED Course  I have reviewed the triage vital signs and the nursing notes.  Pertinent labs & imaging results that were available during my care of the patient were reviewed by me and considered in my medical decision making (see chart for details).  Clinical Course as of 12/02/20 1752  Tue Dec 02, 2020  1409 Hemoglobin: 13.8 [HK]  1409 Pulse Rate(!): 44 [HK]  1446 Creatinine: 0.99 [HK]  1446 Sodium: 141 [HK]  1446 Potassium: 4.0 [HK]  1503 Alcohol, Ethyl (B): <10  [HK]  1503 Glucose(!): 121 [HK]    Clinical Course User Index [HK] Dietrich Pates, PA-C   MDM Rules/Calculators/A&P                          45 year old male with past medical history of anxiety presenting to the ED with chief complaint of syncopal episode.  Was at his PCPs office this morning getting fasting labs.  He began feeling lightheaded.  He was told that he had an episode of shaking.  He denies any tongue trauma.  Complaining of headache on the right side of his head.  Denies striking his head when this happened.  No alcohol on a daily basis, drug use, recent medication changes, vision changes, numbness in arms or legs, chest pain or shortness of breath.  Reports increased stressors at work.  Had COVID in the beginning of March 2022 and has been feeling "off balance" when he lays down since his diagnosis.  He is concerned that he may have vertigo because of this.  This is unchanged.  On exam patient without any neurological deficits.  No numbness or weakness on exam.  No meningeal signs noted.  No facial asymmetry noted.  Abdomen is soft.  Patient with heart rates in the 50s to 60s which he states is normal for him.  EKG shows sinus bradycardia without any changes from prior tracings.  CT of the head without any abnormalities.  CMP, CBC unremarkable.  Ethanol level is 0.  Patient was observed here for about 4 hours without any subsequent seizure-like episodes.  I feel that his symptoms are most likely related to a vagal response from getting his blood drawn. There are no headache characteristics that are lateralizing or concerning for increased ICP, infectious or vascular cause of his symptoms.  We will have him continue meclizine at home and follow-up with primary care provider.  Meclizine improved his symptoms here.  Return precautions given.    Patient is hemodynamically stable, in NAD, and able to ambulate in the ED. Evaluation does not show pathology that would require ongoing emergent  intervention or inpatient treatment. I explained the diagnosis to the patient. Pain has been managed and has no complaints prior to discharge. Patient is comfortable with above plan and is stable for discharge at this time. All questions were answered prior to disposition. Strict return precautions for returning to the ED were discussed. Encouraged follow up with PCP.   An After Visit Summary was printed and given to the patient.   Portions of this note were generated with Scientist, clinical (histocompatibility and immunogenetics)Dragon dictation software. Dictation errors may occur despite best attempts at proofreading.  Final Clinical Impression(s) / ED Diagnoses Final diagnoses:  Syncope, unspecified syncope type    Rx / DC Orders ED Discharge Orders          Ordered    meclizine (ANTIVERT) 25 MG tablet  3 times daily PRN        12/02/20 1751             Dietrich PatesKhatri, Kordelia Severin, PA-C 12/02/20 1752    Gwyneth SproutPlunkett, Whitney, MD 12/04/20 1232

## 2021-12-09 IMAGING — CT CT HEAD W/O CM
3 of 4 series · 15 of 47 positions shown, 18 images · non-contrast
Comparison: Head CT August 12, 2010

CLINICAL DATA: Seizure, nontraumatic.

EXAM:
CT HEAD WITHOUT CONTRAST
TECHNIQUE: Contiguous axial images were obtained from the base of the skull
through the vertex without intravenous contrast.

[Series 3: head 2.0 h70h · axial · 0.50mm/px · z∈[-79,+59]mm · 9 of 87 slices shown, 12 images]
[im 9/87  brain]
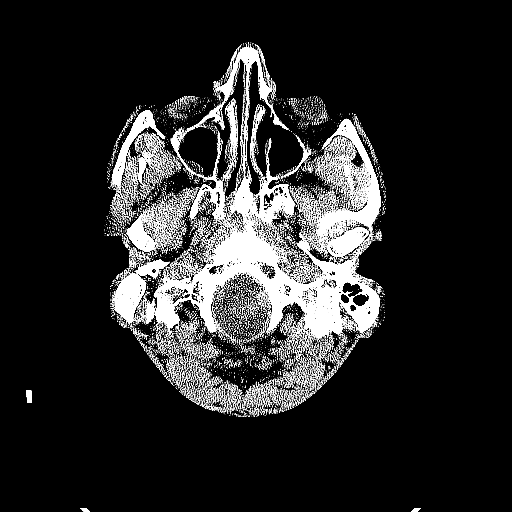
[im 9/87  bone]
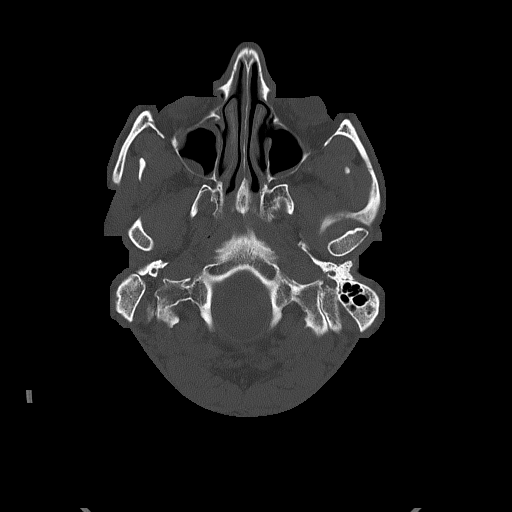
[im 18/87  brain]
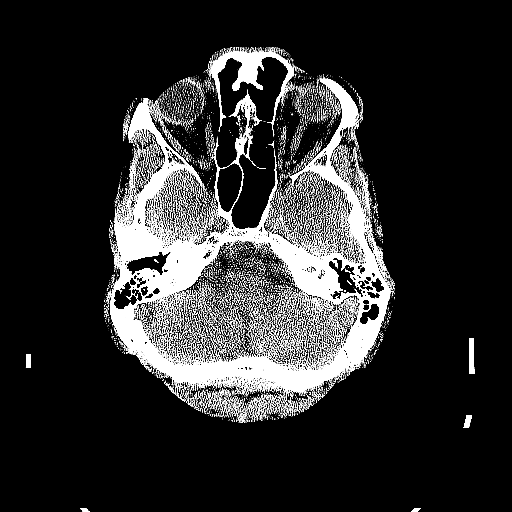
[im 26/87  brain]
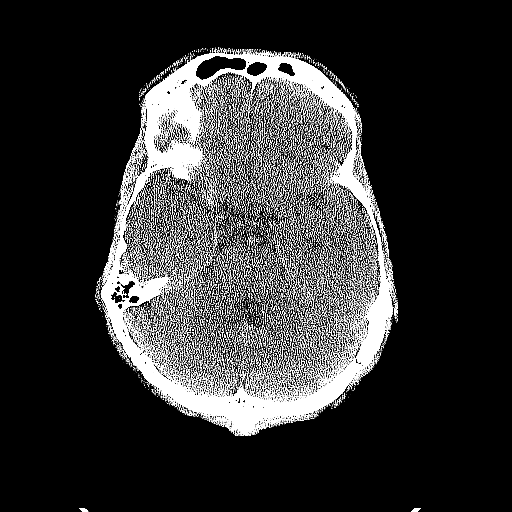
[im 35/87  brain]
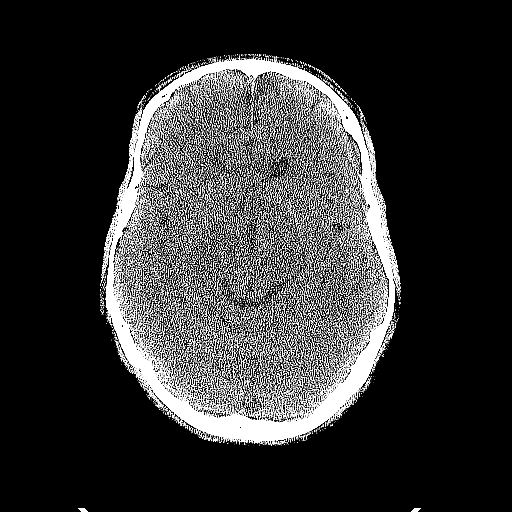
[im 44/87  brain]
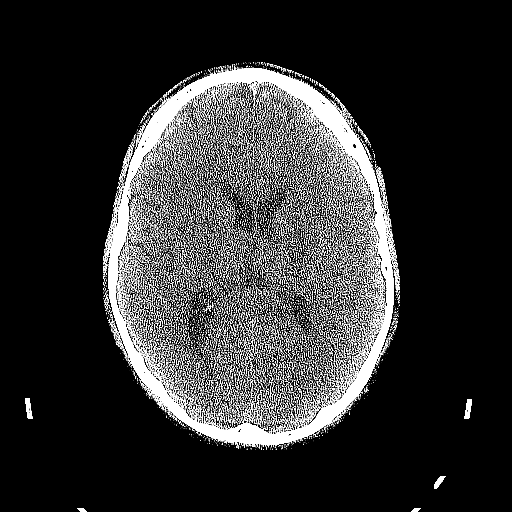
[im 44/87  bone]
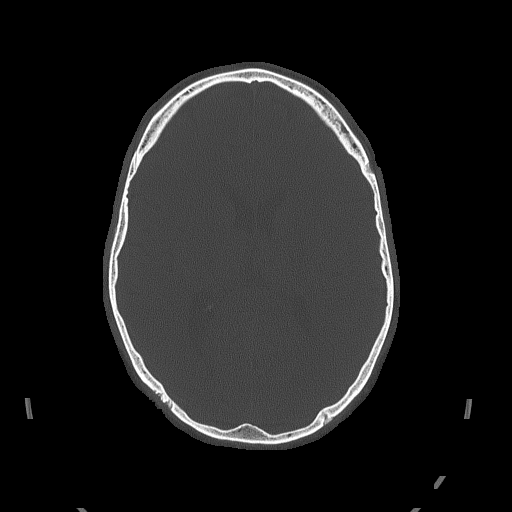
[im 52/87  brain]
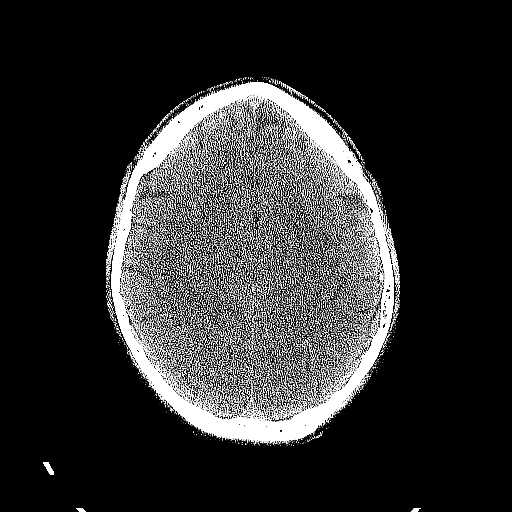
[im 61/87  brain]
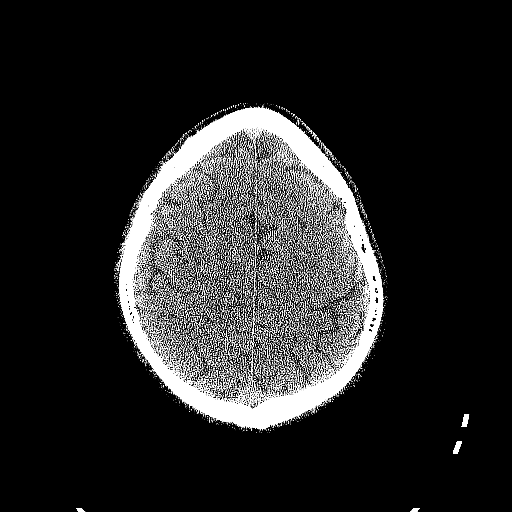
[im 69/87  brain]
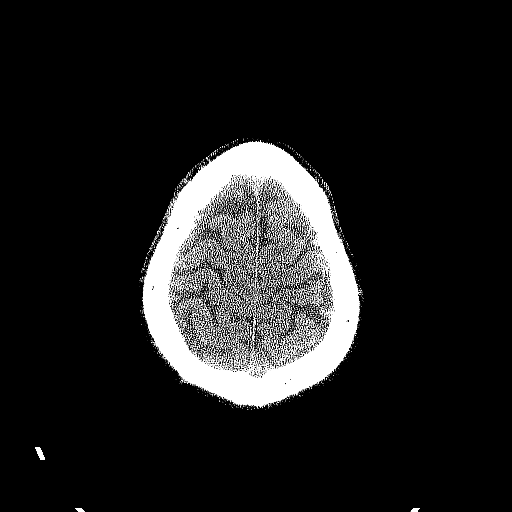
[im 78/87  brain]
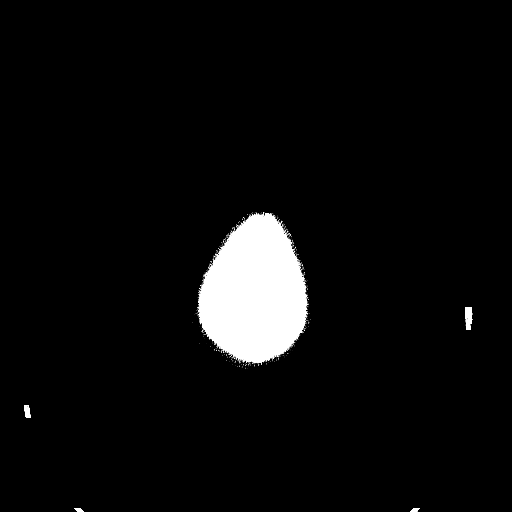
[im 78/87  bone]
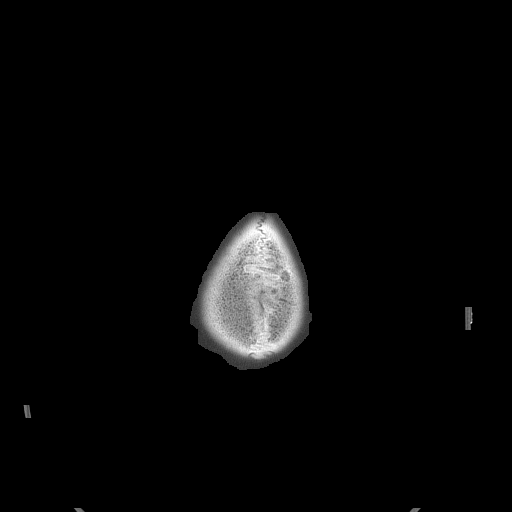

[Series 5: head 3.0 mpr cor · coronal · 0.33mm/px · 3 of 70 slices shown]
[im 24/70  brain]
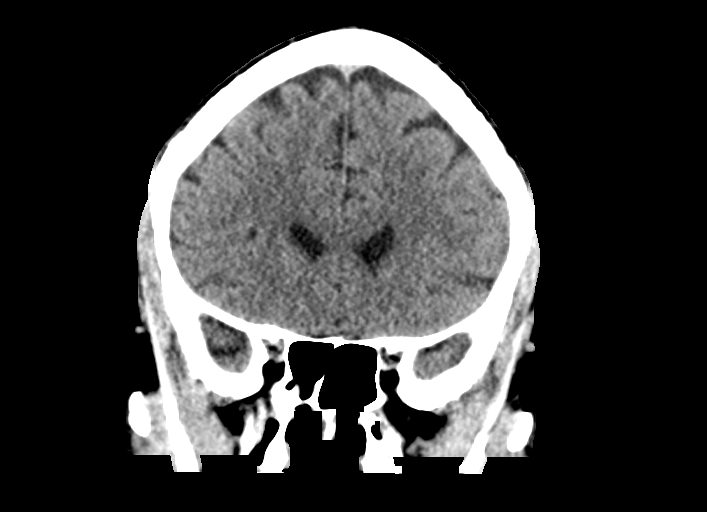
[im 31/70  brain]
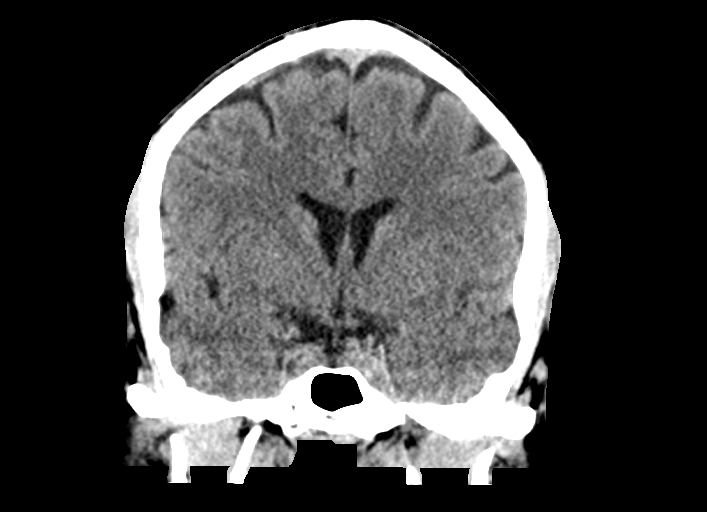
[im 39/70  brain]
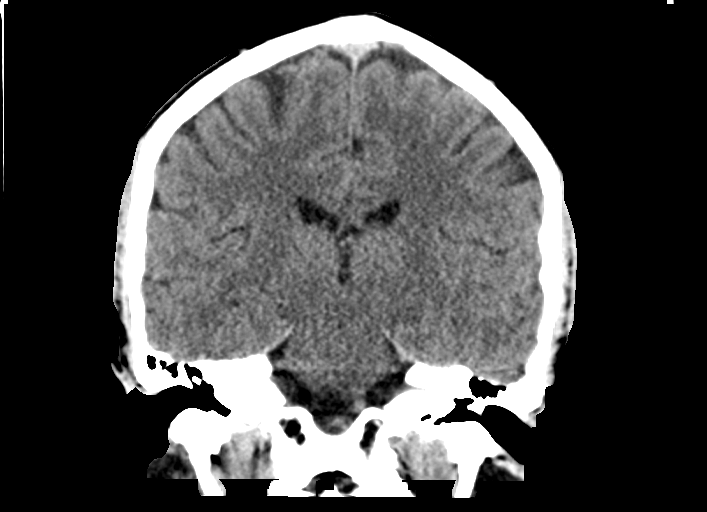

[Series 6: head 3.0 mpr sag · sagittal · 0.33mm/px · 3 of 58 slices shown]
[im 20/58  brain]
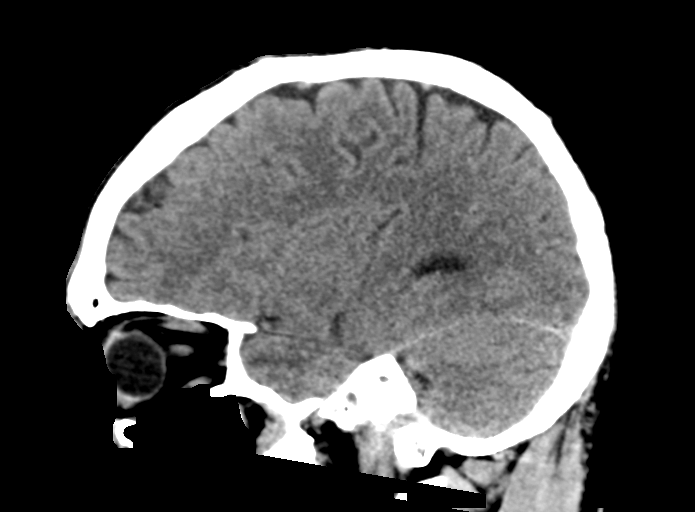
[im 29/58  brain]
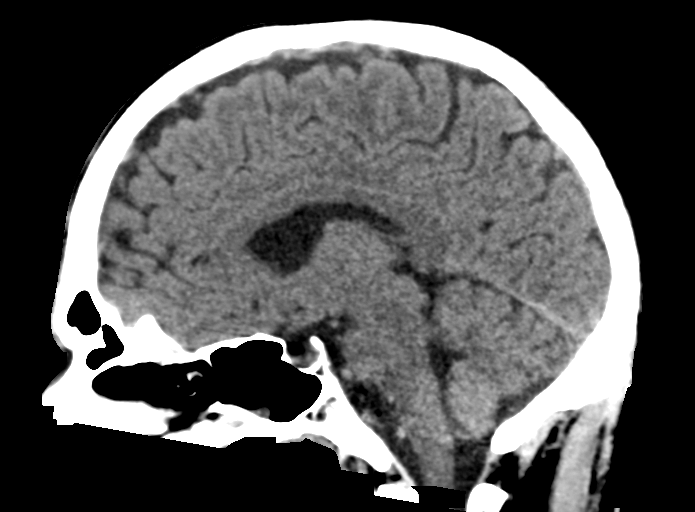
[im 39/58  brain]
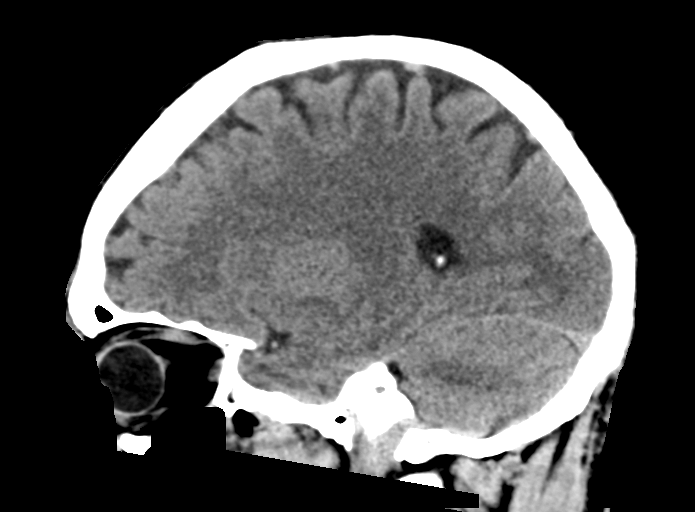

[15 of 47 positions shown; findings below may reference images not displayed]

FINDINGS: Brain: No evidence of acute infarction, hemorrhage, hydrocephalus,
extra-axial collection or mass lesion/mass effect.

Vascular: No hyperdense vessel or unexpected calcification.

Skull: Normal. Negative for fracture or focal lesion.

Sinuses/Orbits: Mucosal thickening of the bilateral maxillary
sinuses.

Other: None.
IMPRESSION: No acute intracranial abnormality.

## 2022-02-24 ENCOUNTER — Encounter: Payer: Self-pay | Admitting: Family Medicine

## 2022-02-24 ENCOUNTER — Ambulatory Visit: Payer: Self-pay

## 2022-02-24 ENCOUNTER — Ambulatory Visit (INDEPENDENT_AMBULATORY_CARE_PROVIDER_SITE_OTHER): Payer: BC Managed Care – PPO | Admitting: Family Medicine

## 2022-02-24 VITALS — BP 134/68 | Ht 72.0 in | Wt 210.0 lb

## 2022-02-24 DIAGNOSIS — M25562 Pain in left knee: Secondary | ICD-10-CM

## 2022-02-24 NOTE — Progress Notes (Signed)
   Established Patient Office Visit  Subjective   Patient ID: Cory Alexander, male    DOB: 08/25/1975  Age: 46 y.o. MRN: [id number]  Left knee pain.  He presents today with chief complaint of left knee pain for 1 month after waterskiing.  He does recall an incident where he had some wobbling of his knee.  He was able to complete waterskiing that day without any immediate pain.  He denied any acute swelling or bruising at the time.  His pain has been consistent for the past 4 weeks, located on the medial aspect of his knee and described as sharp when placed in certain positions.  He is able to jog in a straight line without any worsening of his pain.  He denies any swelling, locking, popping or buckling.  He does have some hesitation and has not yet tried pickleball but would like to get back to sport.  ROS as listed above in HPI    Objective:     BP 134/68   Ht 6' (1.829 m)   Wt 210 lb (95.3 kg)   BMI 28.48 kg/m   Physical Exam Vitals reviewed.  Constitutional:      General: He is not in acute distress.    Appearance: Normal appearance. He is not ill-appearing, toxic-appearing or diaphoretic.  HENT:     Head: Normocephalic.  Cardiovascular:     Rate and Rhythm: Normal rate.  Pulmonary:     Effort: Pulmonary effort is normal.  Neurological:     Mental Status: He is alert.  Left knee: No obvious deformity or asymmetry.  No ecchymosis or edema.  Tenderness to palpation along the posterior medial joint line.  Full range of motion flexion and extension.  Strength 5/5 with flexion and extension at the knee without pain.  Stable to varus and valgus stress.  He does get some discomfort with varus stress but no laxity noted.  Negative Lachman's, ant/post drawers.  Negative McMurray's, negative Thessaly's.  Limited ultrasound left knee: There is no effusion seen.  Medial meniscus-visualized, no obvious deformity or extrusion seen.  No hypoechoic fluid surrounding the area Lateral  meniscus-visualized, no obvious deformity.  Some mild extrusion seen in the posterior aspect. MCL-visualized, there is no obvious deformity or hypoechoic areas of the tendon. No Baker's cyst is appreciated  Impression: No obvious ligamentous abnormality visualized.  Ultrasound was performed and interpreted by Drs. Katha Kuehne and [name]    Assessment & Plan:   Problem List Items Addressed This Visit       Other   Acute pain of left knee - Primary    Based on clinical and radiographical exam patient likely had some minor injury to his meniscus and a sprain of his MCL 1 month ago.  Patient may continue with his light jogging regimen in a straight line.  Would avoid agility exercises for the next couple weeks.  Recommend regular oral anti-inflammatory for the next 7 days.  Ambulation and activity while wearing knee compression sleeve.  He was given quad and hamstring rehabilitation exercises.  After about 2 weeks he may slowly begin to incorporate agility exercises as tolerated.  I anticipate he will do well and be able to go back to pickleball in the next couple weeks.      Relevant Orders   [place] LIMITED JOINT SPACE STRUCTURES LOW LEFT    Return if symptoms worsen or fail to improve.    Elmore Guise, DO

## 2022-02-24 NOTE — Patient Instructions (Signed)
I suspect you have tweaked your meniscus and strained your MCL waterskiing.  This will continue to improve.  Recommend hinged knee brace or compression sleeve for the next 4 weeks.  Aleve 2 tablets twice a day for the next 7 days.  You may continue light jogging in a straight line.  Complete quad and hamstring rehabilitation exercises 2-3 times a day for the next few weeks.   Over the next couple weeks you may try agility exercises as tolerated with the brace or sleeve.  Follow-up as needed

## 2022-02-24 NOTE — Assessment & Plan Note (Signed)
Based on clinical and radiographical exam patient likely had some minor injury to his meniscus and a sprain of his MCL 1 month ago.  Patient may continue with his light jogging regimen in a straight line.  Would avoid agility exercises for the next couple weeks.  Recommend regular oral anti-inflammatory for the next 7 days.  Ambulation and activity while wearing knee compression sleeve.  He was given quad and hamstring rehabilitation exercises.  After about 2 weeks he may slowly begin to incorporate agility exercises as tolerated.  I anticipate he will do well and be able to go back to pickleball in the next couple weeks.
